# Patient Record
Sex: Female | Born: 1935 | Race: Black or African American | Hispanic: No | Smoking: Never smoker
Health system: Southern US, Community
[De-identification: ages and names within clinical notes are randomized; demographics above are authoritative.]

## PROBLEM LIST (undated history)

## (undated) DIAGNOSIS — N183 Chronic kidney disease, stage 3 unspecified: Secondary | ICD-10-CM

## (undated) DIAGNOSIS — L989 Disorder of the skin and subcutaneous tissue, unspecified: Secondary | ICD-10-CM

## (undated) DIAGNOSIS — Z9289 Personal history of other medical treatment: Secondary | ICD-10-CM

## (undated) DIAGNOSIS — A048 Other specified bacterial intestinal infections: Secondary | ICD-10-CM

## (undated) DIAGNOSIS — D649 Anemia, unspecified: Secondary | ICD-10-CM

## (undated) DIAGNOSIS — M810 Age-related osteoporosis without current pathological fracture: Secondary | ICD-10-CM

## (undated) DIAGNOSIS — C801 Malignant (primary) neoplasm, unspecified: Secondary | ICD-10-CM

## (undated) DIAGNOSIS — I10 Essential (primary) hypertension: Secondary | ICD-10-CM

## (undated) HISTORY — DX: Personal history of other medical treatment: Z92.89

## (undated) HISTORY — DX: Age-related osteoporosis without current pathological fracture: M81.0

## (undated) HISTORY — DX: Other specified bacterial intestinal infections: A04.8

## (undated) HISTORY — DX: Chronic kidney disease, stage 3 (moderate): N18.3

## (undated) HISTORY — DX: Disorder of the skin and subcutaneous tissue, unspecified: L98.9

## (undated) HISTORY — PX: OTHER SURGICAL HISTORY: SHX169

## (undated) HISTORY — DX: Chronic kidney disease, stage 3 unspecified: N18.30

## (undated) HISTORY — DX: Essential (primary) hypertension: I10

---

## 1998-10-14 ENCOUNTER — Inpatient Hospital Stay (HOSPITAL_COMMUNITY): Admission: EM | Admit: 1998-10-14 | Discharge: 1998-10-18 | Payer: Self-pay | Admitting: Emergency Medicine

## 1998-10-16 ENCOUNTER — Encounter: Payer: Self-pay | Admitting: Family Medicine

## 1998-10-26 ENCOUNTER — Encounter: Admission: RE | Admit: 1998-10-26 | Discharge: 1998-10-26 | Payer: Self-pay | Admitting: Family Medicine

## 2001-05-30 ENCOUNTER — Other Ambulatory Visit: Admission: RE | Admit: 2001-05-30 | Discharge: 2001-05-30 | Payer: Self-pay | Admitting: Obstetrics and Gynecology

## 2001-09-01 ENCOUNTER — Encounter: Admission: RE | Admit: 2001-09-01 | Discharge: 2001-09-01 | Payer: Self-pay | Admitting: Family Medicine

## 2001-09-01 ENCOUNTER — Encounter: Payer: Self-pay | Admitting: Family Medicine

## 2002-04-11 ENCOUNTER — Emergency Department (HOSPITAL_COMMUNITY): Admission: EM | Admit: 2002-04-11 | Discharge: 2002-04-11 | Payer: Self-pay | Admitting: Emergency Medicine

## 2002-09-08 ENCOUNTER — Encounter: Admission: RE | Admit: 2002-09-08 | Discharge: 2002-09-08 | Payer: Self-pay | Admitting: Family Medicine

## 2002-09-08 ENCOUNTER — Encounter: Payer: Self-pay | Admitting: Family Medicine

## 2003-02-12 ENCOUNTER — Emergency Department (HOSPITAL_COMMUNITY): Admission: EM | Admit: 2003-02-12 | Discharge: 2003-02-12 | Payer: Self-pay | Admitting: Emergency Medicine

## 2003-05-25 ENCOUNTER — Encounter: Admission: RE | Admit: 2003-05-25 | Discharge: 2003-05-25 | Payer: Self-pay | Admitting: Family Medicine

## 2003-05-29 ENCOUNTER — Encounter: Admission: RE | Admit: 2003-05-29 | Discharge: 2003-05-29 | Payer: Self-pay | Admitting: Family Medicine

## 2003-06-13 ENCOUNTER — Encounter: Admission: RE | Admit: 2003-06-13 | Discharge: 2003-06-13 | Payer: Self-pay | Admitting: Sports Medicine

## 2003-06-28 ENCOUNTER — Encounter: Admission: RE | Admit: 2003-06-28 | Discharge: 2003-06-28 | Payer: Self-pay | Admitting: Family Medicine

## 2003-07-12 ENCOUNTER — Encounter: Admission: RE | Admit: 2003-07-12 | Discharge: 2003-07-12 | Payer: Self-pay | Admitting: Family Medicine

## 2003-08-23 ENCOUNTER — Encounter: Admission: RE | Admit: 2003-08-23 | Discharge: 2003-08-23 | Payer: Self-pay | Admitting: Family Medicine

## 2003-09-06 ENCOUNTER — Encounter: Admission: RE | Admit: 2003-09-06 | Discharge: 2003-09-06 | Payer: Self-pay | Admitting: Sports Medicine

## 2003-09-10 ENCOUNTER — Encounter: Admission: RE | Admit: 2003-09-10 | Discharge: 2003-09-10 | Payer: Self-pay | Admitting: Sports Medicine

## 2003-09-21 ENCOUNTER — Encounter: Admission: RE | Admit: 2003-09-21 | Discharge: 2003-09-21 | Payer: Self-pay | Admitting: Family Medicine

## 2003-10-08 ENCOUNTER — Encounter: Admission: RE | Admit: 2003-10-08 | Discharge: 2003-10-08 | Payer: Self-pay | Admitting: Family Medicine

## 2003-10-23 ENCOUNTER — Encounter: Admission: RE | Admit: 2003-10-23 | Discharge: 2003-10-23 | Payer: Self-pay | Admitting: Family Medicine

## 2004-01-17 ENCOUNTER — Emergency Department (HOSPITAL_COMMUNITY): Admission: EM | Admit: 2004-01-17 | Discharge: 2004-01-18 | Payer: Self-pay | Admitting: Emergency Medicine

## 2004-06-27 ENCOUNTER — Ambulatory Visit: Payer: Self-pay | Admitting: Family Medicine

## 2004-07-15 ENCOUNTER — Ambulatory Visit: Payer: Self-pay | Admitting: Family Medicine

## 2004-09-23 ENCOUNTER — Ambulatory Visit: Payer: Self-pay | Admitting: Family Medicine

## 2005-02-25 ENCOUNTER — Ambulatory Visit: Payer: Self-pay | Admitting: Family Medicine

## 2005-07-08 ENCOUNTER — Ambulatory Visit: Payer: Self-pay | Admitting: Family Medicine

## 2005-10-08 ENCOUNTER — Ambulatory Visit: Payer: Self-pay | Admitting: Family Medicine

## 2005-10-22 ENCOUNTER — Ambulatory Visit: Payer: Self-pay | Admitting: Family Medicine

## 2005-11-24 ENCOUNTER — Ambulatory Visit: Payer: Self-pay | Admitting: Family Medicine

## 2005-12-02 ENCOUNTER — Ambulatory Visit: Payer: Self-pay | Admitting: Family Medicine

## 2006-01-01 ENCOUNTER — Ambulatory Visit: Payer: Self-pay | Admitting: Family Medicine

## 2006-03-02 ENCOUNTER — Ambulatory Visit: Payer: Self-pay | Admitting: Sports Medicine

## 2006-03-11 ENCOUNTER — Ambulatory Visit: Payer: Self-pay | Admitting: Family Medicine

## 2006-07-08 ENCOUNTER — Ambulatory Visit: Payer: Self-pay | Admitting: Family Medicine

## 2006-07-08 ENCOUNTER — Encounter (INDEPENDENT_AMBULATORY_CARE_PROVIDER_SITE_OTHER): Payer: Self-pay | Admitting: Family Medicine

## 2006-07-08 LAB — CONVERTED CEMR LAB
ALT: 9 units/L (ref 0–35)
AST: 12 units/L (ref 0–37)
Alkaline Phosphatase: 70 units/L (ref 39–117)
CO2: 26 meq/L (ref 19–32)
Creatinine, Ser: 1.87 mg/dL — ABNORMAL HIGH (ref 0.40–1.20)
Sodium: 144 meq/L (ref 135–145)
Total Bilirubin: 0.3 mg/dL (ref 0.3–1.2)
Total Protein: 7.2 g/dL (ref 6.0–8.3)

## 2006-08-19 DIAGNOSIS — M1990 Unspecified osteoarthritis, unspecified site: Secondary | ICD-10-CM | POA: Insufficient documentation

## 2006-08-19 DIAGNOSIS — M949 Disorder of cartilage, unspecified: Secondary | ICD-10-CM

## 2006-08-19 DIAGNOSIS — M899 Disorder of bone, unspecified: Secondary | ICD-10-CM | POA: Insufficient documentation

## 2006-08-19 DIAGNOSIS — G4700 Insomnia, unspecified: Secondary | ICD-10-CM

## 2006-08-19 DIAGNOSIS — I10 Essential (primary) hypertension: Secondary | ICD-10-CM | POA: Insufficient documentation

## 2006-08-19 DIAGNOSIS — N183 Chronic kidney disease, stage 3 (moderate): Secondary | ICD-10-CM

## 2006-10-15 ENCOUNTER — Encounter (INDEPENDENT_AMBULATORY_CARE_PROVIDER_SITE_OTHER): Payer: Self-pay | Admitting: Family Medicine

## 2006-10-15 ENCOUNTER — Ambulatory Visit: Payer: Self-pay | Admitting: Family Medicine

## 2006-10-15 DIAGNOSIS — M25569 Pain in unspecified knee: Secondary | ICD-10-CM

## 2006-10-15 LAB — CONVERTED CEMR LAB
BUN: 32 mg/dL — ABNORMAL HIGH (ref 6–23)
Blood in Urine, dipstick: NEGATIVE
Creatinine, Ser: 2.09 mg/dL — ABNORMAL HIGH (ref 0.40–1.20)
Glucose, Urine, Semiquant: NEGATIVE
Potassium: 3.9 meq/L (ref 3.5–5.3)
Protein, U semiquant: NEGATIVE
Urobilinogen, UA: NEGATIVE

## 2006-10-26 ENCOUNTER — Encounter: Admission: RE | Admit: 2006-10-26 | Discharge: 2006-10-26 | Payer: Self-pay | Admitting: Sports Medicine

## 2006-10-29 ENCOUNTER — Telehealth (INDEPENDENT_AMBULATORY_CARE_PROVIDER_SITE_OTHER): Payer: Self-pay | Admitting: *Deleted

## 2006-11-01 ENCOUNTER — Telehealth: Payer: Self-pay | Admitting: *Deleted

## 2006-12-01 ENCOUNTER — Encounter (INDEPENDENT_AMBULATORY_CARE_PROVIDER_SITE_OTHER): Payer: Self-pay | Admitting: Family Medicine

## 2006-12-09 ENCOUNTER — Encounter (INDEPENDENT_AMBULATORY_CARE_PROVIDER_SITE_OTHER): Payer: Self-pay | Admitting: Family Medicine

## 2006-12-13 ENCOUNTER — Encounter: Admission: RE | Admit: 2006-12-13 | Discharge: 2006-12-13 | Payer: Self-pay | Admitting: Nephrology

## 2006-12-16 ENCOUNTER — Ambulatory Visit: Payer: Self-pay | Admitting: Oncology

## 2006-12-28 ENCOUNTER — Ambulatory Visit: Payer: Self-pay | Admitting: Family Medicine

## 2007-01-05 LAB — CBC WITH DIFFERENTIAL/PLATELET
BASO%: 0.3 % (ref 0.0–2.0)
EOS%: 0.6 % (ref 0.0–7.0)
LYMPH%: 29.4 % (ref 14.0–48.0)
MCH: 31.1 pg (ref 26.0–34.0)
MCHC: 34.3 g/dL (ref 32.0–36.0)
MONO#: 0.7 10*3/uL (ref 0.1–0.9)
Platelets: 225 10*3/uL (ref 145–400)
RBC: 3.64 10*6/uL — ABNORMAL LOW (ref 3.70–5.32)
WBC: 5.7 10*3/uL (ref 3.9–10.0)

## 2007-01-06 ENCOUNTER — Ambulatory Visit (HOSPITAL_COMMUNITY): Admission: RE | Admit: 2007-01-06 | Discharge: 2007-01-06 | Payer: Self-pay | Admitting: Oncology

## 2007-01-07 LAB — COMPREHENSIVE METABOLIC PANEL
ALT: 8 U/L (ref 0–35)
Albumin: 4.4 g/dL (ref 3.5–5.2)
CO2: 24 mEq/L (ref 19–32)
Calcium: 9.4 mg/dL (ref 8.4–10.5)
Chloride: 106 mEq/L (ref 96–112)
Potassium: 4 mEq/L (ref 3.5–5.3)
Sodium: 141 mEq/L (ref 135–145)
Total Protein: 7.3 g/dL (ref 6.0–8.3)

## 2007-01-07 LAB — LACTATE DEHYDROGENASE: LDH: 187 U/L (ref 94–250)

## 2007-01-07 LAB — SPEP & IFE WITH QIG
Albumin ELP: 58.5 % (ref 55.8–66.1)
Alpha-1-Globulin: 5.4 % — ABNORMAL HIGH (ref 2.9–4.9)
Alpha-2-Globulin: 10.7 % (ref 7.1–11.8)
IgG (Immunoglobin G), Serum: 1040 mg/dL (ref 694–1618)
M-Spike, %: 0.32 g/dL
Total Protein, Serum Electrophoresis: 7.3 g/dL (ref 6.0–8.3)

## 2007-01-13 LAB — UIFE/LIGHT CHAINS/TP QN, 24-HR UR
Albumin, U: DETECTED
Free Lambda Excretion/Day: 5.18 mg/d
Free Lambda Lt Chains,Ur: 0.37 mg/dL (ref 0.08–1.01)
Gamma Globulin, Urine: DETECTED — AB
Time: 24 hours
Volume, Urine: 1400 mL

## 2007-01-18 ENCOUNTER — Encounter (INDEPENDENT_AMBULATORY_CARE_PROVIDER_SITE_OTHER): Payer: Self-pay | Admitting: Family Medicine

## 2007-03-18 ENCOUNTER — Encounter (INDEPENDENT_AMBULATORY_CARE_PROVIDER_SITE_OTHER): Payer: Self-pay | Admitting: Family Medicine

## 2007-03-18 ENCOUNTER — Ambulatory Visit: Payer: Self-pay | Admitting: Family Medicine

## 2007-03-18 DIAGNOSIS — M6740 Ganglion, unspecified site: Secondary | ICD-10-CM | POA: Insufficient documentation

## 2007-03-21 LAB — CONVERTED CEMR LAB
Chloride: 104 meq/L (ref 96–112)
Glucose, Bld: 106 mg/dL — ABNORMAL HIGH (ref 70–99)
Potassium: 4.2 meq/L (ref 3.5–5.3)
Sodium: 142 meq/L (ref 135–145)

## 2007-04-06 ENCOUNTER — Encounter (INDEPENDENT_AMBULATORY_CARE_PROVIDER_SITE_OTHER): Payer: Self-pay | Admitting: Family Medicine

## 2007-07-18 ENCOUNTER — Ambulatory Visit: Payer: Self-pay | Admitting: Oncology

## 2007-09-19 ENCOUNTER — Ambulatory Visit: Payer: Self-pay | Admitting: Sports Medicine

## 2007-10-18 ENCOUNTER — Telehealth (INDEPENDENT_AMBULATORY_CARE_PROVIDER_SITE_OTHER): Payer: Self-pay | Admitting: *Deleted

## 2007-10-19 ENCOUNTER — Ambulatory Visit: Payer: Self-pay | Admitting: Family Medicine

## 2007-10-19 DIAGNOSIS — K117 Disturbances of salivary secretion: Secondary | ICD-10-CM

## 2007-10-26 ENCOUNTER — Encounter: Payer: Self-pay | Admitting: *Deleted

## 2007-10-26 ENCOUNTER — Telehealth: Payer: Self-pay | Admitting: *Deleted

## 2007-11-16 ENCOUNTER — Encounter (INDEPENDENT_AMBULATORY_CARE_PROVIDER_SITE_OTHER): Payer: Self-pay | Admitting: Family Medicine

## 2007-12-29 ENCOUNTER — Ambulatory Visit: Payer: Self-pay | Admitting: Family Medicine

## 2008-03-13 ENCOUNTER — Telehealth: Payer: Self-pay | Admitting: *Deleted

## 2008-03-14 ENCOUNTER — Ambulatory Visit: Payer: Self-pay | Admitting: Family Medicine

## 2008-03-14 DIAGNOSIS — M109 Gout, unspecified: Secondary | ICD-10-CM

## 2008-07-03 ENCOUNTER — Ambulatory Visit: Payer: Self-pay | Admitting: Family Medicine

## 2008-07-09 ENCOUNTER — Ambulatory Visit: Payer: Self-pay | Admitting: Family Medicine

## 2008-07-09 ENCOUNTER — Encounter: Payer: Self-pay | Admitting: Family Medicine

## 2008-07-10 LAB — CONVERTED CEMR LAB
Cholesterol: 217 mg/dL — ABNORMAL HIGH (ref 0–200)
LDL Cholesterol: 95 mg/dL (ref 0–99)
Total CHOL/HDL Ratio: 2.1
Triglycerides: 88 mg/dL (ref <150)
VLDL: 18 mg/dL (ref 0–40)

## 2008-11-23 ENCOUNTER — Telehealth: Payer: Self-pay | Admitting: Family Medicine

## 2009-01-01 ENCOUNTER — Telehealth: Payer: Self-pay | Admitting: Family Medicine

## 2009-01-10 ENCOUNTER — Ambulatory Visit: Payer: Self-pay | Admitting: Family Medicine

## 2009-01-10 DIAGNOSIS — R140 Abdominal distension (gaseous): Secondary | ICD-10-CM

## 2009-01-10 DIAGNOSIS — J309 Allergic rhinitis, unspecified: Secondary | ICD-10-CM | POA: Insufficient documentation

## 2009-03-09 IMAGING — CR DG KNEE 3 VIEWS*R*
3 series · 3 of 3 positions shown · non-contrast
Comparison: None.

RIGHT KNEE - 3 VIEW:

CLINICAL DATA: Knee pain

[view not recorded (1 of 3)]
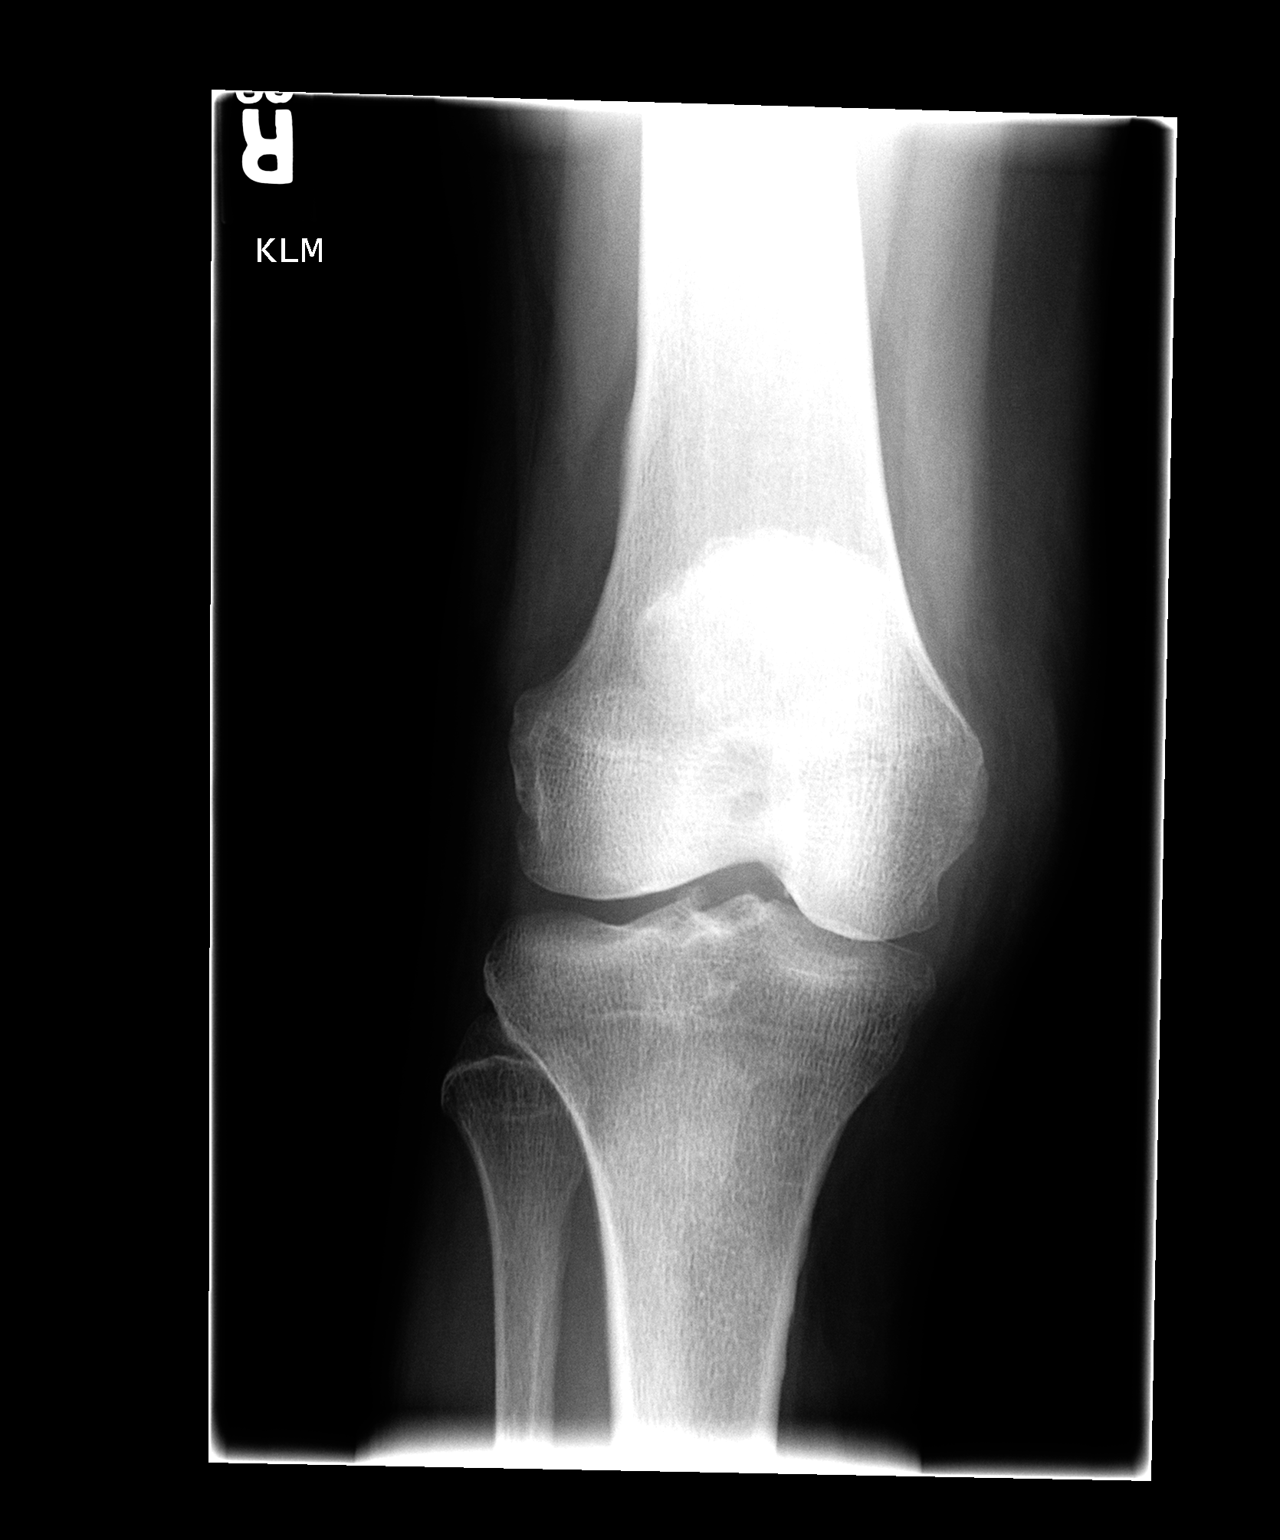

[view not recorded (2 of 3)]
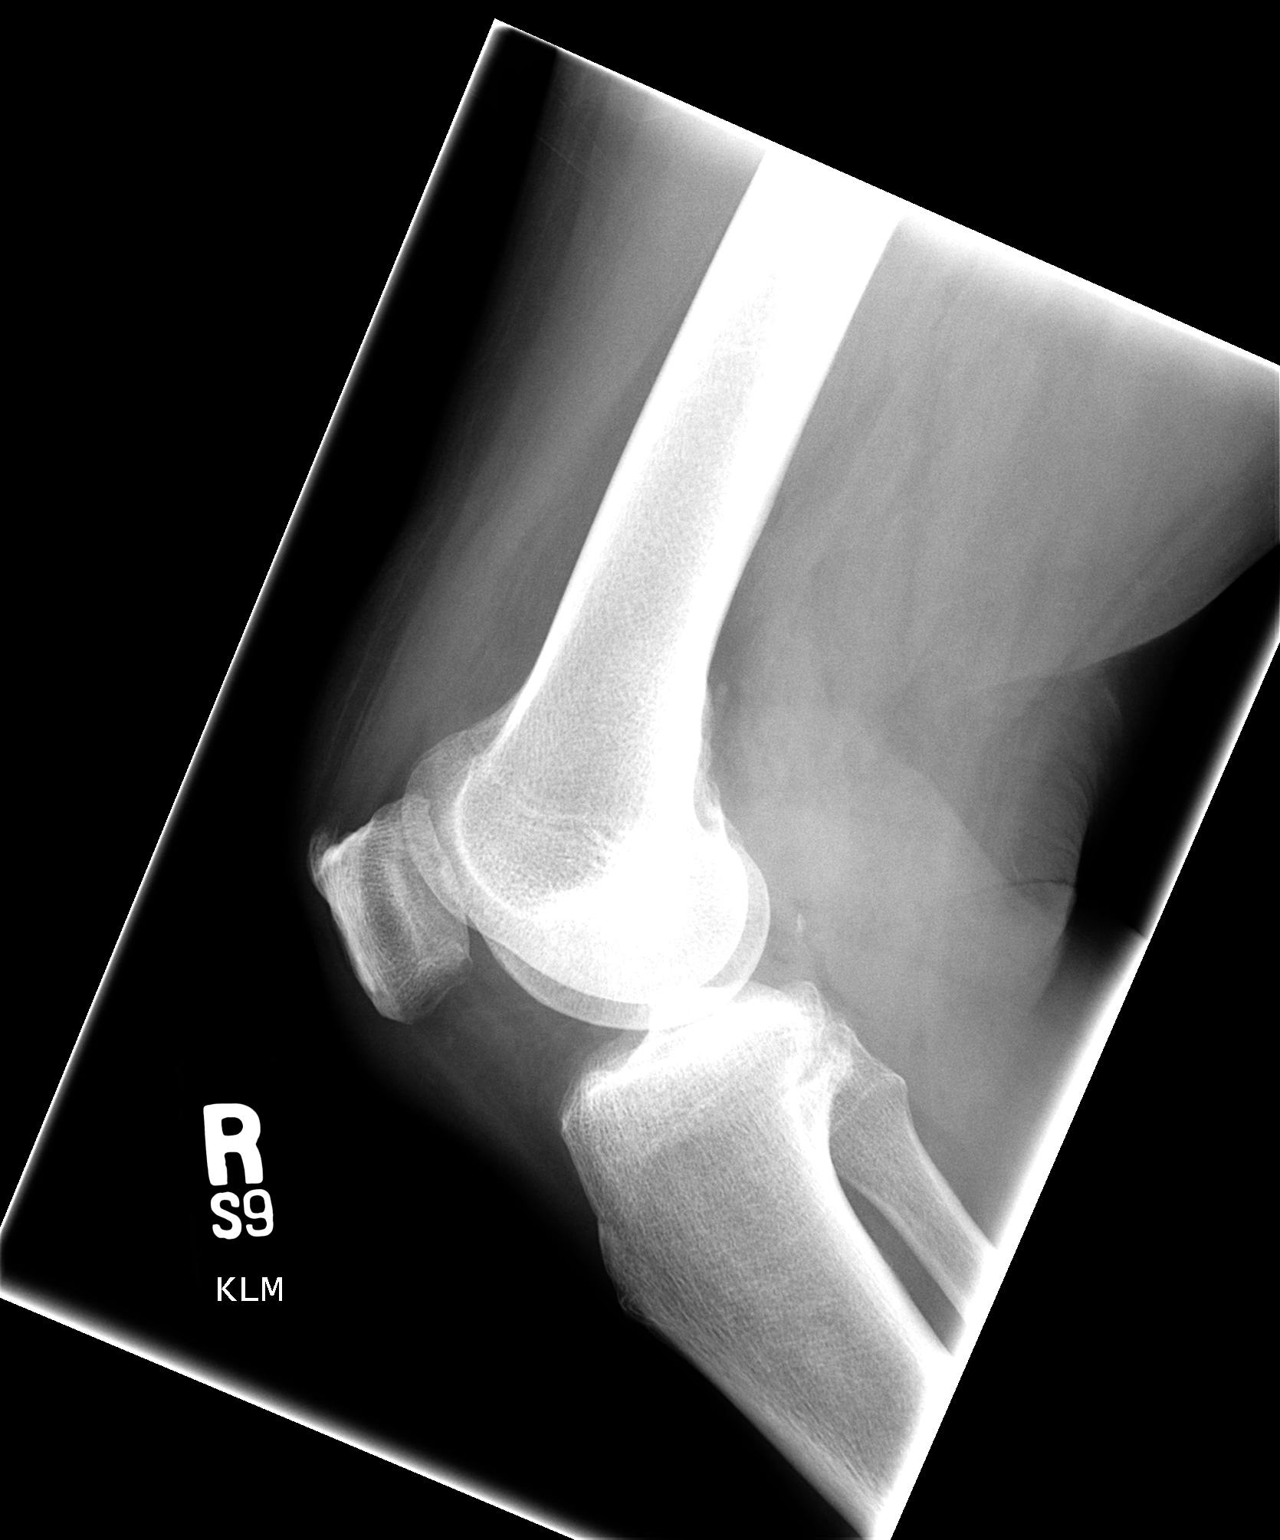

[view not recorded (3 of 3)]
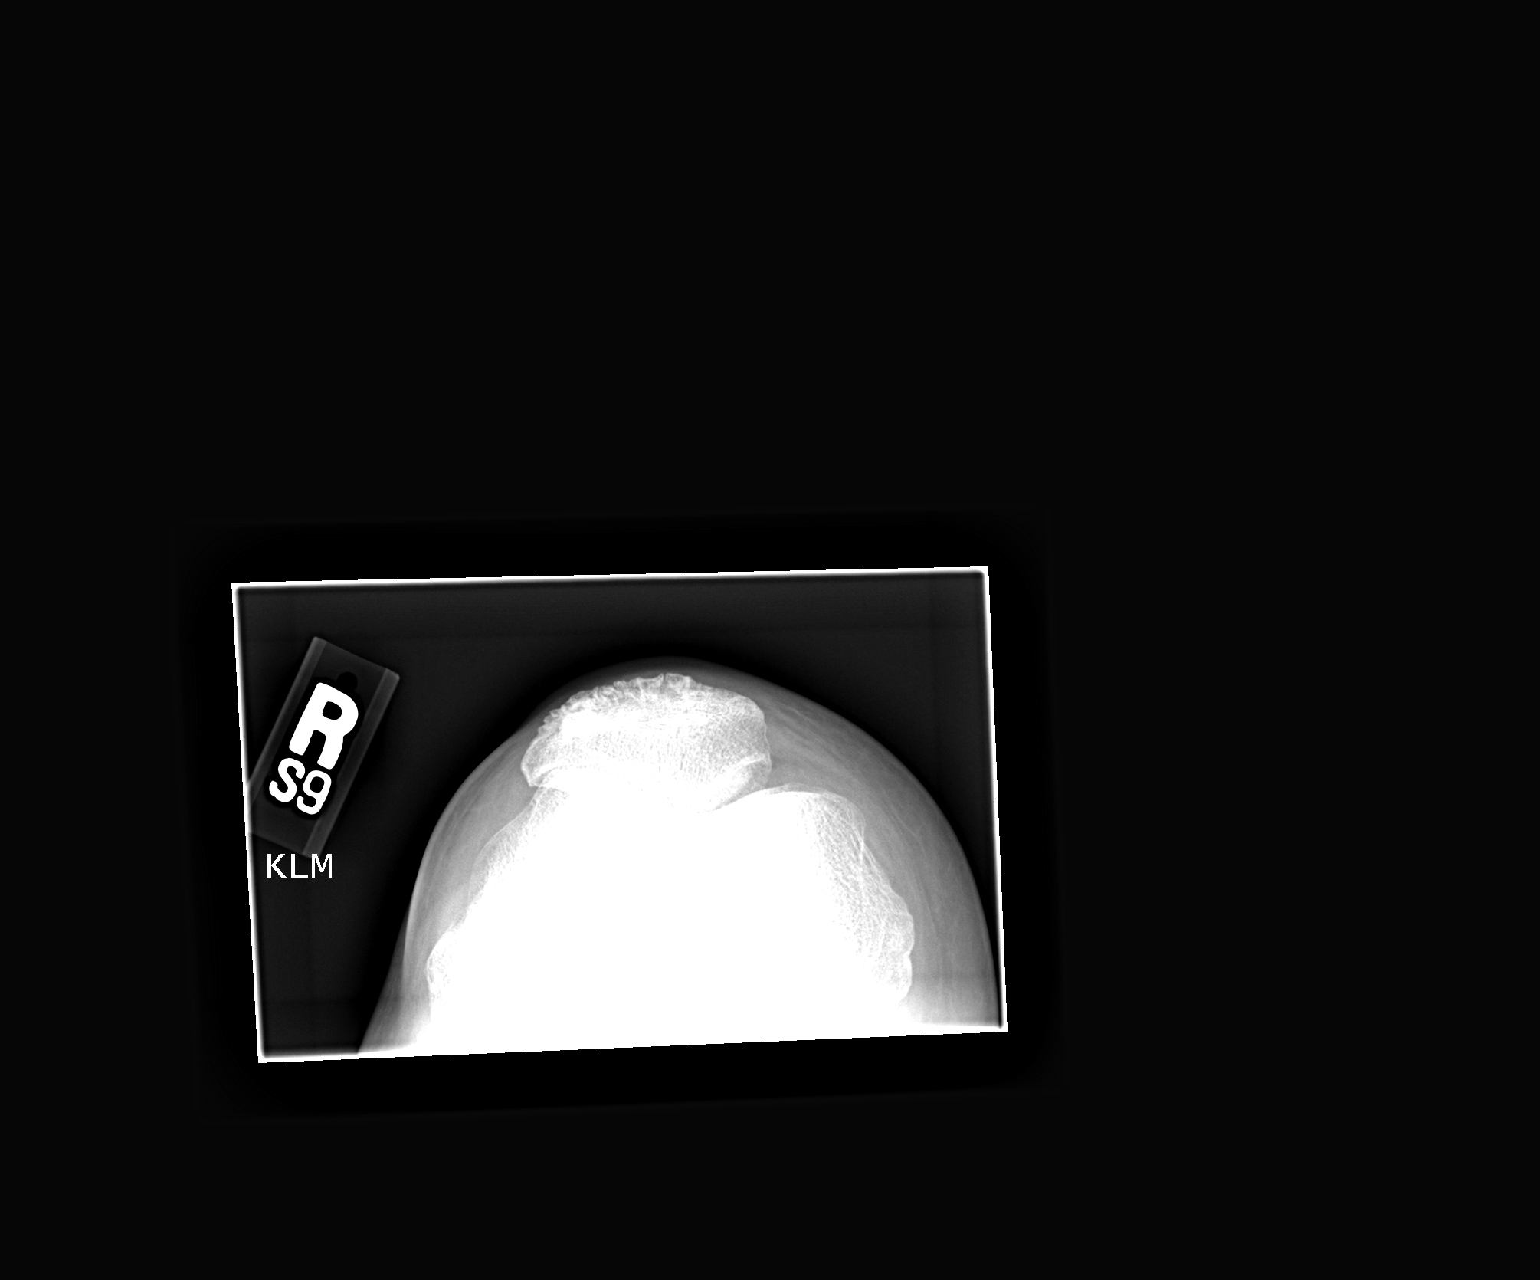

[3 of 3 positions shown; findings below may reference images not displayed]

FINDINGS: No evidence for acute fracture or subluxation. There may be some
joint space narrowing in the medial compartment. Mild tricompartmental
hypertrophic spurring is noted. No evidence for joint effusion.
IMPRESSION: Mild tricompartmental degenerative changes. No acute bony abnormality.

## 2009-05-30 ENCOUNTER — Telehealth: Payer: Self-pay | Admitting: Family Medicine

## 2009-06-26 ENCOUNTER — Encounter: Payer: Self-pay | Admitting: Family Medicine

## 2009-06-26 ENCOUNTER — Ambulatory Visit: Payer: Self-pay | Admitting: Family Medicine

## 2009-06-26 LAB — CONVERTED CEMR LAB
Nitrite: NEGATIVE
Urobilinogen, UA: 0.2

## 2009-06-27 LAB — CONVERTED CEMR LAB
BUN: 23 mg/dL (ref 6–23)
Potassium: 3.9 meq/L (ref 3.5–5.3)
Sodium: 142 meq/L (ref 135–145)

## 2009-07-22 ENCOUNTER — Telehealth: Payer: Self-pay | Admitting: Family Medicine

## 2009-10-15 ENCOUNTER — Ambulatory Visit: Payer: Self-pay | Admitting: Family Medicine

## 2009-12-04 ENCOUNTER — Telehealth: Payer: Self-pay | Admitting: Family Medicine

## 2010-07-13 ENCOUNTER — Encounter: Payer: Self-pay | Admitting: Nephrology

## 2010-07-16 ENCOUNTER — Encounter (INDEPENDENT_AMBULATORY_CARE_PROVIDER_SITE_OTHER): Payer: Self-pay | Admitting: *Deleted

## 2010-07-22 NOTE — Assessment & Plan Note (Signed)
Summary: f/up,tcb   Vital Signs:  Patient profile:   77 year old female Height:      65 inches Weight:      151 pounds Temp:     97.9 degrees F oral Pulse rate:   74 / minute BP sitting:   164 / 70  (right arm) Cuff size:   regular  Vitals Entered By: Tessie Fass CMA (June 26, 2009 4:51 PM) CC: F/U Pain Assessment Patient in pain? no        Primary Care Provider:  Bobby Rumpf  MD  CC:  F/U.  History of Present Illness: 1)  HTN: 164/70. Denies chest pain, headache, dyspnea, LE edema, side effects from meds. On clonidine, norvasc, coreg. Last Cr check in 2008 = 2.11   2) Insomina: still continues to have. drnks ne cup coffee in am, two mountain dews in afternoon, evening. waking at night 2-3 tmes to urinate. no incontinence or dysuria or increased thirst. goes to bed at 9:30, wakes up around 12 , hard to go back to sleep. watches some tv before bed. quiet dark environment for sleep. denies snoring. epworth sleepiness scale score = 4 on last check, low prob sleep apnea. denies depressive symptoms.     Habits & Providers  Alcohol-Tobacco-Diet     Tobacco Status: never  Current Medications (verified): 1)  Aspirin Ec 81 Mg Tbec (Aspirin) .... Take 1 Tablet By Mouth Once A Day 2)  Caltrate 600+d Plus 600-400 Mg-Unit Tabs (Calcium Carbonate-Vit D-Min) .... Take 1 Tablet By Mouth Twice A Day 3)  Coreg 25 Mg Tabs (Carvedilol) .... Take 1 Tablet By Mouth Twice A Day 4)  Norvasc 10 Mg Tabs (Amlodipine Besylate) .... Take 1 Tablet By Mouth Once A Day 5)  Clonidine Hcl 0.1 Mg Tabs (Clonidine Hcl) .... One Tab By Mouth Bid 6)  Dialyvite   Tabs (B Complex-C-Folic Acid) .Marland Kitchen.. 1 Tablet A Day 7)  Prednisone 20 Mg Tabs (Prednisone) .... 2 Tabs By Mouth For 3 Days Then One Tab By Mouth For 4 Days 8)  Gas Relief 80 Mg Chew (Simethicone) .... Take One Pill By Mouth Up To 4 Times A Day As Needed For Gas or Bloating 9)  Allergy 10 Mg Tabs (Loratadine) .... Take One Tablet By Mouth Once A  Day As Needed For Allergies  Allergies (verified): 1)  ! Ace Inhibitors  Physical Exam  General:  alert, well-developed, well-nourished, and well-hydrated.   Eyes:  perrl, eomi Mouth:  mmm Neck:  no bruits or jvd Lungs:  ctab w/o crackles or wheeze, normal wob Heart:  normal rate, regular rhythm, no murmur, no gallop, and no rub.   Pulses:  2 radials Extremities:  no edema  Neurologic:  alert & oriented X3 and cranial nerves II-XII intact.     Impression & Recommendations:  Problem # 1:  HYPERTENSION, BENIGN SYSTEMIC (ICD-401.1) Assessment Unchanged above goal today. will allow permissive hypertension given age higher 160/90. advised regarding salt intake, exercise.   Her updated medication list for this problem includes:    Coreg 25 Mg Tabs (Carvedilol) .Marland Kitchen... Take 1 tablet by mouth twice a day    Norvasc 10 Mg Tabs (Amlodipine besylate) .Marland Kitchen... Take 1 tablet by mouth once a day    Clonidine Hcl 0.1 Mg Tabs (Clonidine hcl) ..... One tab by mouth bid  Orders: Basic Met-[facility] ([address]62-95284) Urinalysis-[facility] (00000) [facility]- Est  Level 4 (13244)  Problem # 2:  RENAL INSUFFICIENCY, CHRONIC (ICD-586) will check bmet, ua.  Orders: [facility]- Est  Level 4 (13086)  Problem # 3:  INSOMNIA NOS (ICD-780.52) Assessment: Unchanged   Will not start medication at this time due to patient preference. Advised patient regarding reorienting day/night cues, finding activities to keep busy during day, and overall good sleep hygiene. Advised to decrease caffeine. Will check ua, bmet to eval nocturia. follow up one month  Orders: [facility]- Est  Level 4 (99214)  Complete Medication List: 1)  Aspirin Ec 81 Mg Tbec (Aspirin) .... Take 1 tablet by mouth once a day 2)  Caltrate 600+d Plus 600-400 Mg-unit Tabs (Calcium carbonate-vit d-min) .... Take 1 tablet by mouth twice a day 3)  Coreg 25 Mg Tabs (Carvedilol) .... Take 1 tablet by mouth twice a day 4)  Norvasc 10 Mg Tabs (Amlodipine besylate) .... Take 1  tablet by mouth once a day 5)  Clonidine Hcl 0.1 Mg Tabs (Clonidine hcl) .... One tab by mouth bid 6)  Dialyvite Tabs (B complex-c-folic acid) .Marland Kitchen.. 1 tablet a day 7)  Prednisone 20 Mg Tabs (Prednisone) .... 2 tabs by mouth for 3 days then one tab by mouth for 4 days 8)  Gas Relief 80 Mg Chew (Simethicone) .... Take one pill by mouth up to 4 times a day as needed for gas or bloating 9)  Allergy 10 Mg Tabs (Loratadine) .... Take one tablet by mouth once a day as needed for allergies  Laboratory Results   Urine Tests  Date/Time Received: June 26, 2009 5:08 PM  Date/Time Reported: June 26, 2009 5:34 PM   Routine Urinalysis   Color: yellow Appearance: Clear Glucose: negative   (Normal Range: Negative) Bilirubin: negative   (Normal Range: Negative) Ketone: negative   (Normal Range: Negative) Spec. Gravity: 1.015   (Normal Range: 1.003-1.035) Blood: negative   (Normal Range: Negative) pH: 5.5   (Normal Range: 5.0-8.0) Protein: 100   (Normal Range: Negative) Urobilinogen: 0.2   (Normal Range: 0-1) Nitrite: negative   (Normal Range: Negative) Leukocyte Esterace: trace   (Normal Range: Negative)  Urine Microscopic WBC/HPF: 5-10 RBC/HPF: rare Bacteria/HPF: 2+ Mucous/HPF: trace Epithelial/HPF: 1-5 Casts/LPF: occ hyaline Yeast/HPF: hypae/spores present Other: occ trich present    Comments: ...........test performed by...........Marland KitchenTerese Door, CMA       Prevention & Chronic Care Immunizations   Influenza vaccine: Not documented   Influenza vaccine due: Refused  (07/03/2008)    Tetanus booster: 09/21/2003: Done.   Tetanus booster due: 09/20/2013    Pneumococcal vaccine: Not documented    H. zoster vaccine: Not documented  Colorectal Screening   Hemoccult: Done.  (02/25/2005)   Hemoccult due: 02/25/2006    Colonoscopy: Not documented  Other Screening   Pap smear: Not documented    Mammogram: Done.  (08/21/2003)   Mammogram due: 08/20/2004    DXA bone  density scan: Done.  (10/21/2003)   DXA scan due: None    Smoking status: never  (06/26/2009)  Lipids   Total Cholesterol: 217  (07/09/2008)   LDL: 95  (07/09/2008)   LDL Direct: Not documented   HDL: 104  (07/09/2008)   Triglycerides: 88  (07/09/2008)  Hypertension   Last Blood Pressure: 164 / 70  (06/26/2009)   Serum creatinine: 2.11  (03/18/2007)   BMP action: Ordered   Serum potassium 4.2  (03/18/2007)   Basic metabolic panel due: 12/24/2009    Hypertension flowsheet reviewed?: Yes   Progress toward BP goal: Unchanged  Self-Management Support :   Personal Goals (by the next clinic visit) :  Personal blood pressure goal: 160/90  (06/26/2009)   Patient will work on the following items until the next clinic visit to reach self-care goals:     Medications and monitoring: take my medicines every day, check my blood pressure, bring all of my medications to every visit, weigh myself weekly  (06/26/2009)     Eating: use fresh or frozen vegetables, eat foods that are low in salt  (06/26/2009)    Hypertension self-management support: Written self-care plan, Education handout, Pre-printed educational material  (06/26/2009)   Hypertension self-care plan printed.   Hypertension education handout printed

## 2010-07-22 NOTE — Progress Notes (Signed)
Summary: Rx Req  Phone Note Refill Request Call back at Home Phone [phone] Message from:  DUAGHTER Christine Armstrong  Refills Requested: Medication #1:  COREG 25 MG TABS Take 1 tablet by mouth twice a day PT USES [facility].  Initial call taken by: Clydell Hakim,  July 22, 2009 4:55 PM  Follow-up for Phone Call        Refilled via e-script.  Follow-up by: Bobby Rumpf  MD,  July 23, 2009 8:46 AM

## 2010-07-22 NOTE — Assessment & Plan Note (Signed)
Summary: f/up,tcb   Vital Signs:  Patient profile:   77 year old female Height:      65 inches Weight:      144.6 pounds Temp:     97.8 degrees F oral Pulse rate:   74 / minute BP sitting:   128 / 72  (left arm) Cuff size:   regular  Vitals Entered By: Garen Grams LPN (October 15, 2009 4:31 PM) CC: f/u last visit Is Patient Diabetic? No Pain Assessment Patient in pain? no        Primary Care Provider:  Bobby Rumpf  MD  CC:  f/u last visit.  History of Present Illness: 1) HTN: 128/72 today. Last visit was 164/70. Denies chest pain, headache, dyspnea, LE edema, side effects from meds. On clonidine, norvasc, coreg. No changes made to medications; patient has been "eating more salads and watching salt". Last Cr check in 1/11 = 1.81. In 2008 was = 2.11   2) Insomina: Resolved. Has eliminated caffeine, beverages before bedtime. No more waking at night to urinate. Improved sleep hygiene with less TV before bed. Denies depressive symptoms.   3) Flatulence: Reports flatulence and bloating intermittently which was relieved by simethicone pills. She has not taken these in a few months and gas has returned. Denies constipation, diarrhea, abdominal pain.     Habits & Providers  Alcohol-Tobacco-Diet     Tobacco Status: never  Current Medications (verified): 1)  Aspirin Ec 81 Mg Tbec (Aspirin) .... Take 1 Tablet By Mouth Once A Day 2)  Caltrate 600+d Plus 600-400 Mg-Unit Tabs (Calcium Carbonate-Vit D-Min) .... Take 1 Tablet By Mouth Twice A Day 3)  Coreg 25 Mg Tabs (Carvedilol) .... Take 1 Tablet By Mouth Twice A Day 4)  Norvasc 10 Mg Tabs (Amlodipine Besylate) .... Take 1 Tablet By Mouth Once A Day 5)  Clonidine Hcl 0.1 Mg Tabs (Clonidine Hcl) .... One Tab By Mouth Bid 6)  Dialyvite   Tabs (B Complex-C-Folic Acid) .Marland Kitchen.. 1 Tablet A Day 7)  Prednisone 20 Mg Tabs (Prednisone) .... 2 Tabs By Mouth For 3 Days Then One Tab By Mouth For 4 Days 8)  Gas Relief 80 Mg Chew (Simethicone) ....  Take One Pill By Mouth Up To 4 Times A Day As Needed For Gas or Bloating 9)  Allergy 10 Mg Tabs (Loratadine) .... Take One Tablet By Mouth Once A Day As Needed For Allergies  Allergies (verified): 1)  ! Ace Inhibitors  Physical Exam  General:  alert, well-developed, well-nourished, and well-hydrated.   Lungs:  ctab w/o crackles or wheeze, normal wob Heart:  normal rate, regular rhythm, no murmur, no gallop, and no rub.   Abdomen:  +BS, S/ND/NT, mo masses  Pulses:  2 radials Extremities:  no edema  Neurologic:  alert & oriented X3.     Impression & Recommendations:  Problem # 1:  HYPERTENSION, BENIGN SYSTEMIC (ICD-401.1)  Improved with DASH diet. Will continue meds as below. Goal of 160/80 given age. Will follow up one year.  Her updated medication list for this problem includes:    Coreg 25 Mg Tabs (Carvedilol) .Marland Kitchen... Take 1 tablet by mouth twice a day    Norvasc 10 Mg Tabs (Amlodipine besylate) .Marland Kitchen... Take 1 tablet by mouth once a day    Clonidine Hcl 0.1 Mg Tabs (Clonidine hcl) ..... One tab by mouth bid  BP today: 128/72 Prior BP: 164/70 (06/26/2009)  Labs Reviewed: K+: 3.9 (06/26/2009) Creat: : 1.81 (06/26/2009)   Chol: 217 (07/09/2008)  HDL: 104 (07/09/2008)   LDL: 95 (07/09/2008)   TG: 88 (07/09/2008)  Orders: [facility]- Est  Level 4 ([address])  Problem # 2:  FLATULENCE (ICD-787.3)  Simethicone for relief as needed. No red flags on history or exam.   Orders: [facility]- Est  Level 4 (60454)  Problem # 3:  INSOMNIA NOS (ICD-780.52) Assessment: Improved  Resolved. Advised to continue good sleep hygiene, decreased caffeine.   Orders: [facility]- Est  Level 4 (99214)  Complete Medication List: 1)  Aspirin Ec 81 Mg Tbec (Aspirin) .... Take 1 tablet by mouth once a day 2)  Caltrate 600+d Plus 600-400 Mg-unit Tabs (Calcium carbonate-vit d-min) .... Take 1 tablet by mouth twice a day 3)  Coreg 25 Mg Tabs (Carvedilol) .... Take 1 tablet by mouth twice a day 4)  Norvasc 10 Mg Tabs  (Amlodipine besylate) .... Take 1 tablet by mouth once a day 5)  Clonidine Hcl 0.1 Mg Tabs (Clonidine hcl) .... One tab by mouth bid 6)  Dialyvite Tabs (B complex-c-folic acid) .Marland Kitchen.. 1 tablet a day 7)  Prednisone 20 Mg Tabs (Prednisone) .... 2 tabs by mouth for 3 days then one tab by mouth for 4 days 8)  Gas Relief 80 Mg Chew (Simethicone) .... Take one pill by mouth up to 4 times a day as needed for gas or bloating 9)  Allergy 10 Mg Tabs (Loratadine) .... Take one tablet by mouth once a day as needed for allergies Prescriptions: GAS RELIEF 80 MG CHEW (SIMETHICONE) Take one pill by mouth up to 4 times a day as needed for gas or bloating  #30 x 6   Entered and Authorized by:   Bobby Rumpf  MD   Signed by:   Bobby Rumpf  MD on 10/15/2009   Method used:   Electronically to        [facility] [id number] (retail)       [address] [place], [place]  [address]       Ph: [phone]       Fax: [phone]   RxID:   [id number] COREG 25 MG TABS (CARVEDILOL) Take 1 tablet by mouth twice a day  #60 Tablet x 5   Entered and Authorized by:   Bobby Rumpf  MD   Signed by:   Bobby Rumpf  MD on 10/15/2009   Method used:   Electronically to        [facility] [address] (retail)       [address] [place], [place]  [address]       Ph: [phone]       Fax: [phone]   RxID:   [id number] NORVASC 10 MG TABS (AMLODIPINE BESYLATE) Take 1 tablet by mouth once a day  #30 Tablet x 5   Entered and Authorized by:   Bobby Rumpf  MD   Signed by:   Bobby Rumpf  MD on 10/15/2009   Method used:   Electronically to        [facility] [address] (retail)       [address] [place], [place]  [address]       Ph: [phone]       Fax: [phone]   RxID:  [id number] CLONIDINE HCL 0.1 MG TABS (CLONIDINE HCL) one tab by mouth bid  #60 x 3   Entered and Authorized by:   Bobby Rumpf  MD   Signed by:    Bobby Rumpf  MD on 10/15/2009   Method used:   Electronically to        [facility] [address] (retail)       [address]       [place], [place]  [address]       Ph: [phone]       Fax: [phone]   RxID:   [id number]    Prevention & Chronic Care Immunizations   Influenza vaccine: Not documented   Influenza vaccine due: Refused  (07/03/2008)    Tetanus booster: 09/21/2003: Done.   Tetanus booster due: 09/20/2013    Pneumococcal vaccine: Not documented    H. zoster vaccine: Not documented  Colorectal Screening   Hemoccult: Done.  (02/25/2005)   Hemoccult due: 02/25/2006    Colonoscopy: Not documented  Other Screening   Pap smear: Not documented    Mammogram: Done.  (08/21/2003)   Mammogram due: 08/20/2004    DXA bone density scan: Done.  (10/21/2003)   DXA scan due: None    Smoking status: never  (10/15/2009)  Lipids   Total Cholesterol: 217  (07/09/2008)   LDL: 95  (07/09/2008)   LDL Direct: Not documented   HDL: 104  (07/09/2008)   Triglycerides: 88  (07/09/2008)  Hypertension   Last Blood Pressure: 128 / 72  (10/15/2009)   Serum creatinine: 1.81  (06/26/2009)   BMP action: Ordered   Serum potassium 3.9  (06/26/2009)   Basic metabolic panel due: 12/24/2009    Hypertension flowsheet reviewed?: Yes   Progress toward BP goal: At goal  Self-Management Support :   Personal Goals (by the next clinic visit) :      Personal blood pressure goal: 160/80  (10/15/2009)   Hypertension self-management support: Written self-care plan, Education handout, Pre-printed educational material  (06/26/2009)    Hypertension self-management support not done because: Good outcomes  (10/15/2009)

## 2010-07-22 NOTE — Progress Notes (Signed)
Summary: Rx Ques  Phone Note Call from Patient Call back at [phone]   Caller: Daughter-Jawonda Winfree Summary of Call: Would like to verify the medications her mother is on. Initial call taken by: Clydell Hakim,  December 04, 2009 11:59 AM  Follow-up for Phone Call        message left to return call. Follow-up by: Theresia Lo RN,  December 04, 2009 12:12 PM  Additional Follow-up for Phone Call Additional follow up Details #1::        daughter returned call Additional Follow-up by: De Nurse,  December 04, 2009 1:38 PM    Additional Follow-up for Phone Call Additional follow up Details #2::    called and left message again for daughter. advised that in order to give this information to her patient , will need to sign a ROI. advised we can print out copy of her meds and patient can pick up and then give to her.  Follow-up by: Theresia Lo RN,  December 04, 2009 2:50 PM  Additional Follow-up for Phone Call Additional follow up Details #3:: Details for Additional Follow-up Action Taken: Daughter said to please call at [phone]. Additional Follow-up by: Clydell Hakim,  December 04, 2009 4:29 PM  Prescriptions: DIALYVITE   TABS (B COMPLEX-C-FOLIC ACID) 1 tablet a day  #100 x 12   Entered and Authorized by:   Bobby Rumpf  MD   Signed by:   Bobby Rumpf  MD on 12/06/2009   Method used:   Electronically to        [facility] [address] (retail)       [address]       [place], [place]  [address]       Ph: [phone]       Fax: [phone]   RxID:   [id number]  spoke with daughter and then with patient and she states her daughter has been taking care of her meds for 10 years and she gives ok to talk with her. went over med list and all is correct except patient has no current Rx for Dialyvite ( folic acid). will send message to MD to ask if she needs to take this and if so please send in Rx to  [facility]   Theresia Lo RN  December 04, 2009 4:42  PM  Refilled. Discussed with daughter. Uncertain as to why started on Folic acid - no levels were ever drawn, and no CBC (for macrocytic anemia) in records, but will continue for now at patient's daughter's request. Bobby Rumpf  MD  December 06, 2009 2:39 PM

## 2010-07-24 NOTE — Letter (Signed)
Summary: Generic Letter  [facility]  [address]   [place], [place] [address]   Phone: [phone]  Fax: [phone]    07/16/2010  [address] [place], [place]  [address]  Dear Ms. Allison Quarry,  We are happy to let you know that since you are covered under Medicare you are able to have a FREE visit at the [facility] to discuss your HEALTH. This is a new benefit for Medicare.  There will be no co-payment.  At this visit you will meet with Arlys John an expert in wellness and the health coach at our clinic.  At this visit we will discuss ways to keep you healthy and feeling well.  This visit will not replace your regular doctor visit and we cannot refill medications.     You will need to plan to be here at least one hour to talk about your medical history, your current status, review all of your medications, and discuss your future plans for your health.  This information will be entered into your record for your doctor to have and review.  If you are interested in staying healthy, this type of visit can help.  Please call the office at: [phone], to schedule a "Medicare Wellness Visit".  The day of the visit you should bring in all of your medications, including any vitamins, herbs, over the counter products you take.  Make a list of all the other doctors that you see, so we know who they are. If you have any other health documents please bring them.  We look forward to helping you stay healthy.  Sincerely,   [facility]  iAWV

## 2010-08-22 ENCOUNTER — Emergency Department (HOSPITAL_COMMUNITY): Payer: Medicare Other

## 2010-08-22 ENCOUNTER — Observation Stay (HOSPITAL_COMMUNITY)
Admission: EM | Admit: 2010-08-22 | Discharge: 2010-08-23 | Disposition: A | Discharge status: Home / Self Care | Payer: Medicare Other | Attending: Family Medicine | Admitting: Family Medicine

## 2010-08-22 DIAGNOSIS — R0789 Other chest pain: Principal | ICD-10-CM | POA: Insufficient documentation

## 2010-08-22 DIAGNOSIS — I129 Hypertensive chronic kidney disease with stage 1 through stage 4 chronic kidney disease, or unspecified chronic kidney disease: Secondary | ICD-10-CM | POA: Insufficient documentation

## 2010-08-22 DIAGNOSIS — R0602 Shortness of breath: Secondary | ICD-10-CM | POA: Insufficient documentation

## 2010-08-22 DIAGNOSIS — M1990 Unspecified osteoarthritis, unspecified site: Secondary | ICD-10-CM | POA: Insufficient documentation

## 2010-08-22 DIAGNOSIS — E785 Hyperlipidemia, unspecified: Secondary | ICD-10-CM | POA: Insufficient documentation

## 2010-08-22 DIAGNOSIS — N189 Chronic kidney disease, unspecified: Secondary | ICD-10-CM | POA: Insufficient documentation

## 2010-08-22 DIAGNOSIS — Z7982 Long term (current) use of aspirin: Secondary | ICD-10-CM | POA: Insufficient documentation

## 2010-08-22 DIAGNOSIS — M109 Gout, unspecified: Secondary | ICD-10-CM | POA: Insufficient documentation

## 2010-08-22 LAB — DIFFERENTIAL
Basophils Relative: 0 % (ref 0–1)
Eosinophils Absolute: 0.1 10*3/uL (ref 0.0–0.7)
Monocytes Relative: 12 % (ref 3–12)
Neutrophils Relative %: 49 % (ref 43–77)

## 2010-08-22 LAB — BASIC METABOLIC PANEL
Calcium: 9.2 mg/dL (ref 8.4–10.5)
Chloride: 101 mEq/L (ref 96–112)
Creatinine, Ser: 1.54 mg/dL — ABNORMAL HIGH (ref 0.4–1.2)
GFR calc Af Amer: 39 mL/min — ABNORMAL LOW (ref >60)

## 2010-08-22 LAB — POCT CARDIAC MARKERS
CKMB, poc: 1.2 ng/mL (ref 1.0–8.0)
Myoglobin, poc: 145 ng/mL (ref 12–200)
Troponin i, poc: <0.05 ng/mL (ref 0.00–0.09)

## 2010-08-22 LAB — CBC
MCH: 29 pg (ref 26.0–34.0)
Platelets: 263 10*3/uL (ref 150–400)
RBC: 4.1 MIL/uL (ref 3.87–5.11)

## 2010-08-23 ENCOUNTER — Other Ambulatory Visit: Payer: Self-pay | Admitting: Family Medicine

## 2010-08-23 ENCOUNTER — Observation Stay (HOSPITAL_COMMUNITY): Payer: Medicare Other

## 2010-08-23 LAB — CBC
MCH: 35.3 pg — ABNORMAL HIGH (ref 26.0–34.0)
MCHC: 35.2 g/dL (ref 30.0–36.0)
MCHC: 33 g/dL (ref 30.0–36.0)
MCV: 100.3 fL — ABNORMAL HIGH (ref 78.0–100.0)
Platelets: 216 10*3/uL (ref 150–400)
RDW: 13.2 % (ref 11.5–15.5)
RDW: 13.4 % (ref 11.5–15.5)
WBC: 4.4 10*3/uL (ref 4.0–10.5)

## 2010-08-23 LAB — TSH: TSH: 0.617 u[IU]/mL (ref 0.350–4.500)

## 2010-08-23 LAB — BASIC METABOLIC PANEL
Calcium: 9 mg/dL (ref 8.4–10.5)
GFR calc Af Amer: 41 mL/min — ABNORMAL LOW (ref >60)
GFR calc non Af Amer: 34 mL/min — ABNORMAL LOW (ref >60)
Sodium: 141 mEq/L (ref 135–145)

## 2010-08-23 LAB — LIPID PANEL
Total CHOL/HDL Ratio: 2 RATIO
Triglycerides: 45 mg/dL (ref <150)
VLDL: 9 mg/dL (ref 0–40)

## 2010-08-23 LAB — CARDIAC PANEL(CRET KIN+CKTOT+MB+TROPI)
Relative Index: INVALID (ref 0.0–2.5)
Total CK: 70 U/L (ref 7–177)

## 2010-08-23 LAB — CK TOTAL AND CKMB (NOT AT ARMC)
Relative Index: INVALID (ref 0.0–2.5)
Total CK: 84 U/L (ref 7–177)

## 2010-08-23 LAB — HEMOGLOBIN A1C: Mean Plasma Glucose: 134 mg/dL — ABNORMAL HIGH (ref <117)

## 2010-08-25 ENCOUNTER — Telehealth: Payer: Self-pay | Admitting: Family Medicine

## 2010-08-25 ENCOUNTER — Encounter: Payer: Self-pay | Admitting: Family Medicine

## 2010-08-25 NOTE — Telephone Encounter (Signed)
Went to ED Friday for Chest Pain & L arm numbness. Out of amlodipine since yesterday. Says she called the pharmacy last Friday. Told her I will forward to someone to fill this.Faustino Congress

## 2010-08-25 NOTE — Telephone Encounter (Signed)
Attempted to reorder amlopidine electronically.   Notice states that medication has previous  pended modifications and I am unable to order. Rx called in by phone for one month supply.  Patient has appointment in office tomorrow. Dr. Leveda Anna consulted. Will send message to  Dr. Wallene Huh.

## 2010-08-25 NOTE — Telephone Encounter (Signed)
Checking status of rx request for amlodipine, pt completely out, pt goes to [facility]

## 2010-08-25 NOTE — Telephone Encounter (Signed)
Refill request

## 2010-08-25 NOTE — Telephone Encounter (Signed)
This encounter was created in error - please disregard.

## 2010-08-26 ENCOUNTER — Encounter: Payer: Self-pay | Admitting: Family Medicine

## 2010-08-26 ENCOUNTER — Ambulatory Visit (INDEPENDENT_AMBULATORY_CARE_PROVIDER_SITE_OTHER): Payer: Medicare Other | Admitting: Family Medicine

## 2010-08-26 DIAGNOSIS — R140 Abdominal distension (gaseous): Secondary | ICD-10-CM

## 2010-08-26 DIAGNOSIS — R142 Eructation: Secondary | ICD-10-CM

## 2010-08-26 NOTE — Patient Instructions (Signed)
Take the simethicone tablets as prescribed to help break up gas - this may be the source of your pain. If things are not getting better in one month, come back in to see me.

## 2010-08-27 ENCOUNTER — Encounter: Payer: Self-pay | Admitting: Family Medicine

## 2010-08-27 NOTE — Assessment & Plan Note (Signed)
Likely source of referred pain, given reported symptoms. Will start on simethicone as before. Follow up in one month for CPE.Continue cardiac risk modification. Red flags reviewed with patient.

## 2010-08-27 NOTE — Progress Notes (Signed)
  Subjective:    Patient ID: Christine Armstrong, female    DOB: 1924-04-02, 77 y.o.   MRN: [id number]  HPI 1) Follow up hospitalization: Admitted 08/22/10 to 08/23/10 for atypical chest pain, ruled out for MI. Patient reports that her pain is non-exertional, not relieved by rest and is not substernal. Usually occurs after eating and usually occurs in the morning. Pain is located at left shoulder and radiates down her left arm, and is worse with moving her arm. Pain lasts for 30 minutes to an hour. Pain is sharp and squeezing. Pain is relieved by burping. Patient has history of "gas pains" and flatulence and was started on simethicone by me but had never picked this up. Denies nausea, emesis, heartburn, dyspnea, diaphoresis, LE edema, dysphagia.   Review of Systems As per HPI otherwise negative.    Objective:   Physical Exam  Constitutional: She appears well-developed and well-nourished. No distress.  Neck: Normal range of motion. Neck supple.  Cardiovascular: Normal rate, regular rhythm and normal heart sounds.   No murmur heard. Pulmonary/Chest: Effort normal. No respiratory distress. She has no wheezes. She has no rales. She exhibits no tenderness.  Abdominal: Soft. Bowel sounds are normal. She exhibits no distension and no mass. There is no tenderness. There is no rebound and no guarding.  Musculoskeletal: Normal range of motion. She exhibits no edema and no tenderness.          Assessment & Plan:

## 2010-09-18 ENCOUNTER — Ambulatory Visit: Payer: Self-pay | Admitting: Family Medicine

## 2010-09-19 ENCOUNTER — Ambulatory Visit (INDEPENDENT_AMBULATORY_CARE_PROVIDER_SITE_OTHER): Payer: Medicare Other | Admitting: Family Medicine

## 2010-09-19 ENCOUNTER — Encounter: Payer: Self-pay | Admitting: Family Medicine

## 2010-09-19 DIAGNOSIS — R140 Abdominal distension (gaseous): Secondary | ICD-10-CM

## 2010-09-19 DIAGNOSIS — R141 Gas pain: Secondary | ICD-10-CM

## 2010-09-19 DIAGNOSIS — M62838 Other muscle spasm: Secondary | ICD-10-CM

## 2010-09-20 DIAGNOSIS — M62838 Other muscle spasm: Secondary | ICD-10-CM | POA: Insufficient documentation

## 2010-09-20 NOTE — Assessment & Plan Note (Signed)
Trigger point injection successful. Treat with OTC tylenol prn.

## 2010-09-20 NOTE — Assessment & Plan Note (Addendum)
Improved with simethicone. Continue use prn. Avoid triggers.

## 2010-09-20 NOTE — Progress Notes (Signed)
  Subjective:    Patient ID: Christine Armstrong, female    DOB: [birth year], 77 y.o.   MRN: [id number]  HPI 1) "Gas pains" / shoulder pain: Patient has history of "gas pains" and flatulence and was started on simethicone by me in the past but had never picked this up. Restarted on this at last visit and notes relief of gas pains, but notes a different pain and tightness in her trapezius area, made worse with moving left shoulder, turning head to right. Admitted 08/22/10 to 08/23/10 for atypical chest pain, ruled out for MI (pain was felt to be musculoskeletal related). Patient reports that her pain is non-exertional, not relieved by rest and is not substernal. Usually occurs after eating and usually occurs in the morning. Pain is located at left shoulder and radiates down her left arm, and is worse with moving her arm. Pain lasts for 30 minutes to an hour. Pain is sharp and squeezing. Pain is relieved by burping. Denies nausea, emesis, heartburn, dyspnea, diaphoresis, LE edema, dysphagia.    Review of Systems As per HPI otherwise negative     Objective:   Physical Exam General: elderly, pleasant female, NAD  Lungs: CTAB Heart: RRR, no murmurs  MSK: full ROM at left shoulder with extension, flexion, internal, external rotation. Negative "drop arm, hawkins, empty can tests. Spasm noted in left trapezius with two tripper points noted superiorly   Trigger point injection performed. LEFT trapezius. Consent obtained after explaining procedure. Area cleaned and prepped with alcohol. Topical anesthetic used. 25 gauge needle used to introduce 1/2 cc kenalog 40 / 1/2 cc 2% lidocaine w/ epinephrine into two trigger points left trapezius. Tolerated well. Bandaged with bandaid. Aftercare instructions given.        Assessment & Plan:

## 2010-09-22 ENCOUNTER — Telehealth: Payer: Self-pay | Admitting: Family Medicine

## 2010-09-22 ENCOUNTER — Other Ambulatory Visit: Payer: Self-pay | Admitting: Family Medicine

## 2010-09-22 NOTE — Telephone Encounter (Signed)
Called pharmacy and they do have rx . They will get ready for patient.  Message left on daughter's voicemail.

## 2010-09-22 NOTE — Telephone Encounter (Signed)
Refill request

## 2010-09-22 NOTE — Telephone Encounter (Signed)
Pt's daughter calling regarding mom's amlodipine.  Did not receive original rx filled on 3/3.  Wanted to pick up rx today.

## 2010-09-23 NOTE — H&P (Signed)
Christine Armstrong, Christine Armstrong                ACCOUNT NO.:  [id number]  MEDICAL RECORD NO.:  [id number]           PATIENT TYPE:  I  LOCATION:  4705                         FACILITY:  [facility]  PHYSICIAN:  Pearlean Brownie, M.D.DATE OF BIRTH:  10/04/1923  DATE OF ADMISSION:  08/22/2010 DATE OF DISCHARGE:                             HISTORY & PHYSICAL   PRIMARY CARE PROVIDER:  Bobby Rumpf, MD at [facility].  CHIEF COMPLAINT:  Chest pain.  HISTORY OF PRESENT ILLNESS:  The patient states that she has been having intermittent chest pain for about 3-day.  Tonight around 7:00 p.m. she stood up from the couch and began walking and the chest pain was much worse than it had been before.  For the past several days she has associated the chest pain with exertion up walking.  She says that the pain feels like a squeezing pain, it is not sharp but it goes into her left shoulder and arm.  She says she has not felt short of breath or been diaphoretic associated with this pain.  She denies any other associated symptoms and she says she has never had this pain before.  REVIEW OF SYSTEMS:  Negative for fevers.  Negative for rhinorrhea. Negative for cough.  Negative for dyspnea.  Negative for palpitations. Positive for chest pain.  Negative for nausea, vomiting, diarrhea. Negative for dysuria.  Negative for malaise.  Negative for headaches or visual changes.  ALLERGIES:  ACE INHIBITORS.  HOME MEDICATIONS: 1. Aspirin 81 mg p.o. daily. 2. Coreg 25 mg p.o. b.i.d. 3. Norvasc 10 mg p.o. daily. 4. Clonidine 0.1 mg p.o. b.i.d.  PAST MEDICAL HISTORY:  Significant for; 1. Hypertension. 2. Osteoarthritis. 3. Chronic kidney disease, baseline creatinine 1.7-2.0. 4. History of gout.  SOCIAL HISTORY:  The patient lives with her daughter and her granddaughter.  She is retired.  She denies tobacco, alcohol or drug use.  FAMILY HISTORY:  The patient's mother died of lung cancer.  Her father died  of bladder cancer.  She has a son who died of a myocardial infarction at the age of 52.  Daughter with hypertension.  PHYSICAL EXAMINATION:  VITAL SIGNS:  Temperature 98.7, pulse is 77, respiratory rate 20, blood pressure 155/79, pulse ox is 97% on room air. GENERAL:  The patient is in no acute distress. HEAD, EYES, EARS, NOSE AND THROAT:  Extraocular movements are intact. Pupils are equal, round and reactive to light. NECK:  No JVD.  No adenopathy.  No thyromegaly. CARDIOVASCULAR:  Regular rate and rhythm.  No murmurs, rubs or gallops. CHEST WALL:  The patient has positive reproducible pain with pushing on her chest on the left side. ABDOMEN:  Positive bowel sounds.  Soft, nontender, nondistended. EXTREMITIES:  2+ Pulses.  No edema. NEUROLOGIC:  Alert and oriented x3.  No focal deficits. MUSCULOSKELETAL:  The patient has positive radicular pain that reproduces on pushing on her head.  LABORATORY FINDINGS AND STUDIES: 1. CBC; white blood cell count 5.1, hemoglobin 11.9, hematocrit 35.7,    platelets 263, normal differential. 2. Basic metabolic panel; sodium 136, potassium 4.0, chloride 101,  bicarb 26, BUN 22, creatinine 1.54, glucose 123, calcium 9.2. 3. Point-of-care cardiac enzymes, CK-MB is 1.2, troponin I is less     than 0.05, myoglobin is 145. 4. EKG shows normal sinus rhythm.  No T-wave abnormalities.  ASSESSMENT/PLAN:  This is an 77 year old female who presents with chest pain. 1. Chest pain.  The patient has reproducible signs of musculoskeletal     pain.  However, she does associate the chest pain with exertion.     We will admit her to telemetry and cycle cardiac enzymes to rule     out acute coronary artery syndrome.  We will risk stratify, obtain     hemoglobin A1c, fasting lipid panel and TSH.  We will repeat an EKG     in the morning because the patient's pain is reproducible with     radiculopathy and pushing her chest wall.  We will order a chest x-     ray as  well as a C-spine film to rule out any sort of     musculoskeletal fracture as the cause of her pain and we will     continue her home aspirin 81 mg p.o. daily. 2. Hypertension.  Continue home medications, Coreg, clonidine and     Norvasc for blood pressure control. 3. Fluids, electrolytes, nutrition, gastrointestinal.  We will give a     heart healthy diet.  We will Hep-Lock her IV and for GI     prophylaxis, we will give Protonix 400 mg p.o. daily. 4. Deep vein thrombosis prophylaxis, heparin 5000 units subcu t.i.d. 5. Disposition is pending clinical improvement and acute coronary     artery syndrome rule out.    ______________________________ Christine Gal, MD   ______________________________ Pearlean Brownie, M.D.    CR/MEDQ  D:  08/23/2010  T:  08/23/2010  Job:  [id number]  Electronically Signed by Christine Gal MD on 09/17/2010 04:26:29 PM Electronically Signed by Pearlean Brownie M.D. on 09/23/2010 03:36:25 PM

## 2010-09-23 NOTE — Discharge Summary (Signed)
Christine Armstrong, Christine Armstrong                ACCOUNT NO.:  [id number]  MEDICAL RECORD NO.:  [id number]           PATIENT TYPE:  I  LOCATION:  4705                         FACILITY:  [facility]  PHYSICIAN:  Pearlean Brownie, M.D.DATE OF BIRTH:  [birth year]  DATE OF ADMISSION:  08/22/2010 DATE OF DISCHARGE:  08/23/2010                              DISCHARGE SUMMARY   FINAL DIAGNOSIS:  Atypical chest pain, likely musculoskeletal in nature.  ADDITIONAL DIAGNOSES: 1. Hypertension. 2 . Osteoarthritis. 1. Chronic kidney disease. 2. Gout.  DISCHARGE MEDICATIONS: 1. Aspirin 81 mg p.o. daily. 2. Clonidine 0.1 mg p.o. b.i.d. 3. Coreg 25 mg p.o. b.i.d. 4. Norvasc 10 mg p.o. daily.  LABORATORY RESULTS:  The patient's point-of-care enzymes were negative. The patient's initial CBC showed a white count of 5.1, hemoglobin 11.9, hematocrit 35.7, and platelets 263.  Her BMET was significant for a glucose of 123 and creatinine of 1.54.  Her first set of troponins was 0.01.  CBC on day of discharge was stable from the day before.  Her BMET on day of discharge showed a creatinine of 1.46 and her final troponin was 0.01.  Her lipid profile was drawn, although this was not a fasting lipid; her total cholesterol was 166, triglycerides 45, HDL 83, LDL 74. Her TSH was 0.617 and her A1c was 6.3.  IMAGING STUDIES:  Her cervical spine film showed moderate spondylolisthesis the lower lumbar spine which does not appear to be medically changed and no acute fracture or traumatic subluxation.  Her chest x-ray showed no active disease with hyperinflation, stable right paratracheal soft tissue prominence.  BRIEF HOSPITAL COURSE:  This patient was admitted with complaint of atypical chest pain.  She described the pain as being intermittent for the last 3 days.  However, at 7 p.m. the night of admission, she stood up from the couch and began walking, and the chest pain was worse than had been before.  She described the  pain as worse with exertion and better with rest, and felt like squeezing rather than sharp, but said it radiates into her left shoulder and arm.  The patient was admitted to the hospital for rule out MI.  Her troponins were negative x2 over a period of 16 hours, and on day of discharge she did not have any chest pain.  She did, however, still complained of some neck and shoulder pain, which she says is very similar to the pain that she has been having that she initially came in on.  Her cervical spine films did not show any acute changes.  This pain was thought to be due to musculoskeletal or neuropathy.  She is going to follow up with her PCP, Dr. Wallene Huh for further workup of this problem.  The patient did not have any home medications change during the stay nor were any medications added.  She is felt to be stable on time of discharge.     Ellery Plunk, MD   ______________________________ Pearlean Brownie, M.D.    RS/MEDQ  D:  08/23/2010  T:  08/24/2010  Job:  [id number]  Electronically Signed by Ellery Plunk  on 09/01/2010  01:37:43 PM Electronically Signed by Pearlean Brownie M.D. on 09/23/2010 03:36:14 PM

## 2010-10-08 ENCOUNTER — Other Ambulatory Visit: Payer: Self-pay | Admitting: Family Medicine

## 2010-10-09 NOTE — Telephone Encounter (Signed)
Refill request

## 2010-11-22 ENCOUNTER — Other Ambulatory Visit: Payer: Self-pay | Admitting: Family Medicine

## 2010-11-24 NOTE — Telephone Encounter (Signed)
Refill request

## 2011-02-04 ENCOUNTER — Other Ambulatory Visit: Payer: Self-pay | Admitting: Family Medicine

## 2011-02-04 NOTE — Telephone Encounter (Signed)
Refill request

## 2011-04-04 ENCOUNTER — Other Ambulatory Visit: Payer: Self-pay | Admitting: Family Medicine

## 2011-04-05 NOTE — Telephone Encounter (Signed)
Refill request

## 2011-05-04 ENCOUNTER — Other Ambulatory Visit: Payer: Self-pay | Admitting: Family Medicine

## 2011-05-04 NOTE — Telephone Encounter (Signed)
Refill request

## 2011-06-18 ENCOUNTER — Other Ambulatory Visit: Payer: Self-pay | Admitting: Family Medicine

## 2011-06-18 NOTE — Telephone Encounter (Signed)
Refill request

## 2011-06-19 ENCOUNTER — Encounter: Payer: Self-pay | Admitting: Family Medicine

## 2011-06-19 ENCOUNTER — Ambulatory Visit (INDEPENDENT_AMBULATORY_CARE_PROVIDER_SITE_OTHER): Payer: Medicare Other | Admitting: Family Medicine

## 2011-06-19 DIAGNOSIS — N19 Unspecified kidney failure: Secondary | ICD-10-CM

## 2011-06-19 DIAGNOSIS — I10 Essential (primary) hypertension: Secondary | ICD-10-CM

## 2011-06-19 DIAGNOSIS — Z23 Encounter for immunization: Secondary | ICD-10-CM

## 2011-06-19 MED ORDER — CLONIDINE HCL 0.2 MG PO TABS: 0.2000 mg | ORAL_TABLET | Freq: Two times a day (BID) | ORAL | Status: DC

## 2011-06-19 MED ORDER — CARVEDILOL 25 MG PO TABS: 25.0000 mg | ORAL_TABLET | Freq: Two times a day (BID) | ORAL | Status: DC

## 2011-06-19 MED ORDER — AMLODIPINE BESYLATE 10 MG PO TABS: 10.0000 mg | ORAL_TABLET | Freq: Every day | ORAL | Status: DC

## 2011-06-19 NOTE — Assessment & Plan Note (Signed)
Patient has a history of CKD Lab Results  Component Value Date   CREATININE 1.46* 08/23/2010   Patient declined any type of lab draw today we will get some at her yearly in March or April.

## 2011-06-19 NOTE — Patient Instructions (Signed)
Very nice to meet you both. I refilled all your medications and changed to clonidine. I'm giving you a flu shot today. I when she to come back in 2 weeks to recheck her blood pressure with me. Happy new year!

## 2011-06-19 NOTE — Assessment & Plan Note (Signed)
Uncontrolled at this time will refill her medications and increase her clonidine to 0.2 mg we'll have patient come back in 2 weeks for recheck. Patient given red flags and what to look out for and when to seek medical attention.

## 2011-06-19 NOTE — Progress Notes (Signed)
  Subjective:    Patient ID: Christine Armstrong, female    DOB: 1923/07/05, 77 y.o.   MRN: [id number]  HPI patient states the real reason she is here is for medication refills. 1. Hypertension Blood pressure at home: Not checking Blood pressure today: 22/106 patient was 186/99 on recheck Taking Meds: Patient has been out of her amlodipine and clonidine for the last couple days. Side effects: None ROS: Denies headache visual changes nausea, vomiting, chest pain or abdominal pain or shortness of breath.  Chronic renal insufficiency: Patient has had this for some time we are avoiding any nephro toxic drugs at this time been doing very well. Patient states that she still makes good urine is having no back pain no shortness of breath and no abdominal pain out of the ordinary. Patient declined a blood draw today   Preventative care: Patient will take a flu shot but she has declined a Pneumovax and Zostavax and colonoscopy today.    Review of Systems Denies fever, chills, nausea vomiting abdominal pain, dysuria, chest pain, shortness of breath dyspnea on exertion or numbness in extremities Past medical history, social, surgical and family history all reviewed.     Objective:   Physical Exam Physical Exam  Constitutional: She appears well-developed and well-nourished. No distress.  Neck: Normal range of motion. Neck supple.  Cardiovascular: Normal rate, regular rhythm and normal heart sounds.   No murmur heard. Pulmonary/Chest: Effort normal. No respiratory distress. She has no wheezes. She has no rales. She exhibits no tenderness.  Abdominal: Soft. Bowel sounds are normal. She exhibits no distension and no mass. There is no tenderness. There is no rebound and no guarding.  Musculoskeletal: Normal range of motion. She exhibits no edema and no tenderness.     Assessment & Plan:

## 2011-07-08 ENCOUNTER — Encounter: Payer: Self-pay | Admitting: Family Medicine

## 2011-07-08 ENCOUNTER — Ambulatory Visit (INDEPENDENT_AMBULATORY_CARE_PROVIDER_SITE_OTHER): Payer: Medicare Other | Admitting: Family Medicine

## 2011-07-08 DIAGNOSIS — I10 Essential (primary) hypertension: Secondary | ICD-10-CM

## 2011-07-08 NOTE — Progress Notes (Signed)
  Subjective:    Patient ID: Christine Armstrong, female    DOB: [birth year], 78 y.o.   MRN: [id number]  HPI 1. Hypertension Blood pressure at home:140-160 SBP Blood pressure today: 106/64 Taking Meds:yes  Side effects: Only during the first 3 days now feeling great. ROS: Denies headache visual changes nausea, vomiting, chest pain or abdominal pain or shortness of breath.   Review of Systems As stated in the history of present illness    Objective:   Physical Exam  Constitutional: She appears well-developed and well-nourished. No distress.  Neck: Normal range of motion. Neck supple.  Cardiovascular: Normal rate, regular rhythm and normal heart sounds.   No murmur heard. Pulmonary/Chest: Effort normal. No respiratory distress. She has no wheezes. She has no rales. She exhibits no tenderness.  Abdominal: Soft. Bowel sounds are normal. She exhibits no distension and no mass. There is no tenderness. There is no rebound and no guarding.     Assessment & Plan:

## 2011-07-08 NOTE — Assessment & Plan Note (Signed)
Much better now patient is very well controlled. Is no longer symptomatic on medication. We'll have patient come back in 2 months time at that time we will get labs and monitor patient's chronic kidney disease and blood pressure.

## 2011-07-08 NOTE — Patient Instructions (Signed)
r blood pressure is better I will see you March 19th and we will get labs at that time.

## 2011-09-08 ENCOUNTER — Encounter: Payer: Self-pay | Admitting: Family Medicine

## 2011-09-08 ENCOUNTER — Ambulatory Visit (INDEPENDENT_AMBULATORY_CARE_PROVIDER_SITE_OTHER): Payer: Medicare Other | Admitting: Family Medicine

## 2011-09-08 VITALS — BP 150/79 | HR 71 | Temp 98.0°F | Ht 65.0 in | Wt 139.0 lb

## 2011-09-08 DIAGNOSIS — R739 Hyperglycemia, unspecified: Secondary | ICD-10-CM | POA: Insufficient documentation

## 2011-09-08 DIAGNOSIS — I10 Essential (primary) hypertension: Secondary | ICD-10-CM

## 2011-09-08 DIAGNOSIS — R7309 Other abnormal glucose: Secondary | ICD-10-CM

## 2011-09-08 DIAGNOSIS — M899 Disorder of bone, unspecified: Secondary | ICD-10-CM

## 2011-09-08 DIAGNOSIS — M949 Disorder of cartilage, unspecified: Secondary | ICD-10-CM

## 2011-09-08 NOTE — Patient Instructions (Signed)
You are doing great! I will get labs and call you with the results. We need to see you again in 6 months.

## 2011-09-08 NOTE — Assessment & Plan Note (Signed)
With Hx of osteopenia, pt declined DEXA scan, will continue to monitor.  Will get Vitamin D level today being this would be comparison for bone health and pt risk for falls.

## 2011-09-08 NOTE — Assessment & Plan Note (Signed)
Pt is doing well avoiding renal toxic medications at this time.  Continue to monitor.  Recheck in 6 months.

## 2011-09-08 NOTE — Progress Notes (Signed)
  Subjective:    Patient ID: Christine Armstrong, female    DOB: 05-03-1924, 78 y.o.   MRN: [id number]  Hypertension   1. Hypertension Blood pressure at home: Not checking Blood pressure today:150/79 Taking Meds: yes Side effects: None ROS: Denies headache visual changes nausea, vomiting, chest pain or abdominal pain or shortness of breath. Due for CMET today  2 Hyperglycemia-  Has had this on last 2 blood draws.  A1c  Lab Results  Component Value Date   HGBA1C 6.1 09/08/2011  Pt denies polyuria or dipsia. Pt though has CRI and not a metformin candidate.   3.  Hx of osteopenia- Pt does take calcium, no Vit D, has not checked Vit D  Pt does have DJD but know trouble with walking or balance.    Preventative care: Patient will take a flu shot but she has declined a Pneumovax and Zostavax and colonoscopy today.   Review of Systems  Denies fever, chills, nausea vomiting abdominal pain, dysuria, chest pain, shortness of breath dyspnea on exertion or numbness in extremities Past medical history, social, surgical and family history all reviewed.     Objective:   Physical Exam  Vitals reviewed  Constitutional: She appears well-developed and well-nourished. No distress.  Neck: Normal range of motion. Neck supple.  Cardiovascular: Normal rate, regular rhythm and normal heart sounds.   No murmur heard. Pulmonary/Chest: Effort normal. No respiratory distress. She has no wheezes. She has no rales. She exhibits no tenderness.  Abdominal: Soft. Bowel sounds are normal. She exhibits no distension and no mass. There is no tenderness. There is no rebound and no guarding.  Musculoskeletal: Normal range of motion. She exhibits no edema and no tenderness.     Assessment & Plan:

## 2011-09-09 LAB — COMPREHENSIVE METABOLIC PANEL
AST: 14 U/L (ref 0–37)
Alkaline Phosphatase: 76 U/L (ref 39–117)
BUN: 28 mg/dL — ABNORMAL HIGH (ref 6–23)
Creat: 1.61 mg/dL — ABNORMAL HIGH (ref 0.50–1.10)
Glucose, Bld: 107 mg/dL — ABNORMAL HIGH (ref 70–99)
Potassium: 3.9 mEq/L (ref 3.5–5.3)
Total Bilirubin: 0.3 mg/dL (ref 0.3–1.2)

## 2011-09-09 LAB — VITAMIN D 25 HYDROXY (VIT D DEFICIENCY, FRACTURES): Vit D, 25-Hydroxy: 26 ng/mL — ABNORMAL LOW (ref 30–89)

## 2011-09-09 MED ORDER — ERGOCALCIFEROL 1.25 MG (50000 UT) PO CAPS: 50000.0000 [IU] | ORAL_CAPSULE | ORAL | Status: DC

## 2011-09-09 NOTE — Progress Notes (Signed)
Addended by: Judi Saa on: 09/09/2011 08:55 AM   Modules accepted: Orders

## 2011-11-09 ENCOUNTER — Telehealth: Payer: Self-pay | Admitting: Family Medicine

## 2011-11-09 NOTE — Telephone Encounter (Signed)
Pt dgt informed that most dental offices are pay out of pocket for Medicare as far as I know.  Pt states that mom used to go somewhere on [address].  Advised maybe that was [facility] and they could give her a little more info as they work with people with low income.  Pt dgt grateful. Brittin Belnap, Maryjo Rochester

## 2011-11-09 NOTE — Telephone Encounter (Signed)
Daughter is calling because her mom needs to go to a dentist and she isn't sure if she needs a referral or who the dentist was that she went to several years ago.

## 2012-02-24 ENCOUNTER — Ambulatory Visit (INDEPENDENT_AMBULATORY_CARE_PROVIDER_SITE_OTHER): Payer: Medicare Other | Admitting: Family Medicine

## 2012-02-24 VITALS — BP 158/83 | HR 80 | Temp 97.6°F | Ht 65.0 in | Wt 139.0 lb

## 2012-02-24 DIAGNOSIS — R197 Diarrhea, unspecified: Secondary | ICD-10-CM | POA: Insufficient documentation

## 2012-02-24 DIAGNOSIS — R12 Heartburn: Secondary | ICD-10-CM

## 2012-02-24 DIAGNOSIS — I10 Essential (primary) hypertension: Secondary | ICD-10-CM

## 2012-02-24 DIAGNOSIS — L820 Inflamed seborrheic keratosis: Secondary | ICD-10-CM

## 2012-02-24 DIAGNOSIS — N19 Unspecified kidney failure: Secondary | ICD-10-CM

## 2012-02-24 MED ORDER — OMEPRAZOLE 40 MG PO CPDR: 40.0000 mg | DELAYED_RELEASE_CAPSULE | Freq: Every day | ORAL | Status: DC

## 2012-02-24 MED ORDER — LOPERAMIDE HCL 2 MG PO CAPS: 2.0000 mg | ORAL_CAPSULE | Freq: Four times a day (QID) | ORAL | Status: AC | PRN

## 2012-02-24 NOTE — Patient Instructions (Addendum)
Thank you for coming in today. Please take all of your blood pressure medications as prescribed Please come back in 2 weeks for your blood pressure to be checked and blood work Please start taking imodium for your diarrhea. Come back if it gets worse Start taking your acid pill for 2 weeks Have a great day  Diarrhea Diarrhea is watery poop (stool). The most common cause of diarrhea is a germ. Other causes include:  Food poisoning.   A reaction to medicine.  HOME CARE   Drink clear fluids. This can stop you from losing too much body fluid (dehydration).   Drink enough fluids to keep your pee (urine) clear or pale yellow.   Avoid solid foods and dairy products until you start to feel better. Then start eating bland foods, such as:   Bananas.   Rice.   Crackers.   Applesauce.   Dry toast.   Avoid spicy foods, caffeine, and alcohol.   Your doctor may give medicine to help with cramps and watery poop. Take this as told. Avoid these medicines if you have a fever or blood in your poop.   Take your medicine as told. Finish them even if you start to feel better.  GET HELP RIGHT AWAY IF:   The watery poop lasts longer than 3 days.   You have a fever.   Your baby is older than 3 months with a rectal temperature of 100.5 F (38.1 C) or higher for more than 1 day.   There is blood in your poop.   You start to throw up (vomit).   You lose too much fluid.  MAKE SURE YOU:   Understand these instructions.   Will watch your condition.   Will get help right away if you are not doing well or get worse.  Document Released: 11/25/2007 Document Revised: 05/28/2011 Document Reviewed: 11/25/2007 [employer] Patient Information 2012 [place], [place].

## 2012-02-25 DIAGNOSIS — L989 Disorder of the skin and subcutaneous tissue, unspecified: Secondary | ICD-10-CM | POA: Insufficient documentation

## 2012-02-25 NOTE — Progress Notes (Signed)
  Subjective:    Patient ID: Graciella Freer, female    DOB: Oct 08, 1923, 78 y.o.   MRN: [id number]  HPI  CC: Hrt burn, Diarrhea, Head sore  Heart burn: onset 2wks. Worse after meals and with ASA 81mg . No alleviating factors, no medications. Increased stress over past several weeks. FmHx of reflux but no GI cancer. Denies CO, SOB, diaphoresis  HTN: DId not take carvedilol today. Allergic to ACEi (angioedema). Denies palpitations, CP, lightheadedness, syncope.  Diarrhea: ongonig for 2 wks. Denies sick contacts. Approximately BID BM that are non-bloody, nonmalodorous. No recent infections requiring ABX. Denies fever, n/v/c, lightheadedness, rash, abd pain  L temple lesion. Enlarging L temple skin tag per pt that is becoming more painful and irritated. Lesion is new over the past several months. Scratches the lesion for relief.    Review of Systems Per HPI    Objective:   Physical Exam GEN: NAD CV: RRR no m/r/g ABD: NABS, non-painful to palpation SKin: Numerous SK on head L Temple w/ inflammed SK       Assessment & Plan:

## 2012-02-25 NOTE — Assessment & Plan Note (Signed)
Pt to return in 2 wks for BP check. ACE not an option

## 2012-02-25 NOTE — Assessment & Plan Note (Signed)
Liquid nitrogen. Pt to f/u in 2 wks

## 2012-02-25 NOTE — Assessment & Plan Note (Signed)
Reviewed labs. No recent data. Repeat BMET

## 2012-02-25 NOTE — Assessment & Plan Note (Signed)
Likely viral. Handout given. Fluids and imodium. Return for further workup if not improving or worsens. Red flags discussed

## 2012-02-25 NOTE — Assessment & Plan Note (Addendum)
Omeprazole for 2 wks. May need H. Pylori testing. Pt to avoid triggers

## 2012-03-09 ENCOUNTER — Telehealth: Payer: Self-pay | Admitting: Family Medicine

## 2012-03-09 DIAGNOSIS — R12 Heartburn: Secondary | ICD-10-CM

## 2012-03-09 DIAGNOSIS — R197 Diarrhea, unspecified: Secondary | ICD-10-CM

## 2012-03-09 DIAGNOSIS — L820 Inflamed seborrheic keratosis: Secondary | ICD-10-CM

## 2012-03-09 NOTE — Assessment & Plan Note (Signed)
Resolving. Very infrequent at this time

## 2012-03-09 NOTE — Assessment & Plan Note (Signed)
Resolved as of 03/09/12

## 2012-03-09 NOTE — Telephone Encounter (Signed)
Called and spoke to pt by phone. Pt very happy w/ results from last appt.

## 2012-03-09 NOTE — Assessment & Plan Note (Signed)
SK has fallen off and skin is healing well. No complaints per pt

## 2012-03-10 ENCOUNTER — Ambulatory Visit (INDEPENDENT_AMBULATORY_CARE_PROVIDER_SITE_OTHER): Payer: Medicare Other | Admitting: Family Medicine

## 2012-03-10 ENCOUNTER — Encounter: Payer: Self-pay | Admitting: Family Medicine

## 2012-03-10 VITALS — BP 169/74 | HR 81 | Temp 98.1°F | Ht 64.75 in | Wt 143.3 lb

## 2012-03-10 DIAGNOSIS — L820 Inflamed seborrheic keratosis: Secondary | ICD-10-CM

## 2012-03-10 DIAGNOSIS — M949 Disorder of cartilage, unspecified: Secondary | ICD-10-CM

## 2012-03-10 DIAGNOSIS — N19 Unspecified kidney failure: Secondary | ICD-10-CM

## 2012-03-10 DIAGNOSIS — M899 Disorder of bone, unspecified: Secondary | ICD-10-CM

## 2012-03-10 DIAGNOSIS — Z23 Encounter for immunization: Secondary | ICD-10-CM

## 2012-03-10 DIAGNOSIS — I10 Essential (primary) hypertension: Secondary | ICD-10-CM

## 2012-03-10 DIAGNOSIS — R12 Heartburn: Secondary | ICD-10-CM

## 2012-03-10 DIAGNOSIS — R197 Diarrhea, unspecified: Secondary | ICD-10-CM

## 2012-03-11 LAB — COMPREHENSIVE METABOLIC PANEL
ALT: 8 U/L (ref 0–35)
AST: 12 U/L (ref 0–37)
Albumin: 3.9 g/dL (ref 3.5–5.2)
Calcium: 9.1 mg/dL (ref 8.4–10.5)
Chloride: 108 mEq/L (ref 96–112)
Potassium: 4.1 mEq/L (ref 3.5–5.3)
Total Protein: 6.9 g/dL (ref 6.0–8.3)

## 2012-03-15 ENCOUNTER — Telehealth: Payer: Self-pay | Admitting: Family Medicine

## 2012-03-15 ENCOUNTER — Encounter: Payer: Self-pay | Admitting: Family Medicine

## 2012-03-15 DIAGNOSIS — I10 Essential (primary) hypertension: Secondary | ICD-10-CM

## 2012-03-15 MED ORDER — HYDRALAZINE HCL 10 MG PO TABS: 10.0000 mg | ORAL_TABLET | Freq: Three times a day (TID) | ORAL | Status: DC

## 2012-03-15 NOTE — Assessment & Plan Note (Signed)
Still healing. Much improved. PT happy w/ results thus far. Aware of possible permenant skin blanching

## 2012-03-15 NOTE — Assessment & Plan Note (Addendum)
BP not at goal.  Chronic issue for pt w/ likely cardiorenal syndrome Maxed out on current therapy. Will consider lasix vs HCTX vs spironolactone after labwork

## 2012-03-15 NOTE — Patient Instructions (Signed)
PT instructions given

## 2012-03-15 NOTE — Assessment & Plan Note (Signed)
Vitamin D level today -- Addendum--- normal

## 2012-03-15 NOTE — Assessment & Plan Note (Signed)
Much improved. To conitnue Imodium PRN

## 2012-03-15 NOTE — Telephone Encounter (Signed)
Message copied by Ozella Rocks on Tue Mar 15, 2012 12:13 PM ------      Message from: [facility], Carnelia Oscar J      Created: Tue Mar 15, 2012  6:26 AM       referr to renal

## 2012-03-15 NOTE — Assessment & Plan Note (Addendum)
CMET today Likely Cardiorenal component. Cr elevated w/ GFR in AAF estimated to be 37. Will call pt concerning referral to REnal for initial evaluation and recommendations for BP control

## 2012-03-15 NOTE — Assessment & Plan Note (Signed)
Spoke to pt by phone, SBP 177 at pharmacy.  Adding Hydralazine Appreciate Renal recommendation on diuretic use as renal function declines

## 2012-03-15 NOTE — Progress Notes (Signed)
  Subjective:    Patient ID: Christine Armstrong, female    DOB: 06/28/1923, 78 y.o.   MRN: [id number]  HPI CC: Temple lesion, diarrhea f/u  DIarrhea: Much improved, denies HA, syncope, n/v. Continues to take imodium  HTN: elevated today in clinic, Deneis HA, CP, SOB. Does not check at home. Pt allergic to ACE/ARB.  Inflammed SK: Larey Seat off after cryo therapy. Feels fine today. Very happy w/ results   Review of Systems Per HPI    Objective:   Physical Exam  GEN: NAD CV: RRR no m/r/g ABD: NABS, non-painful to palpation SKin: Helaing SK of L temple. Minimal skin blanching.       Assessment & Plan:

## 2012-03-15 NOTE — Telephone Encounter (Signed)
Spoke to pt and granddaughter over phone. Pt to be referred to renal due to progressive renal decline.  Starting Hydralazine for continued HTN.

## 2012-03-15 NOTE — Assessment & Plan Note (Signed)
Resolved w/ 2wk trial PPI. WIll DC for now

## 2012-03-15 NOTE — Addendum Note (Signed)
Addended by: Ozella Rocks on: 03/15/2012 06:26 AM   Modules accepted: Orders

## 2012-06-23 ENCOUNTER — Other Ambulatory Visit: Payer: Self-pay | Admitting: *Deleted

## 2012-06-28 MED ORDER — CLONIDINE HCL 0.2 MG PO TABS: 0.2000 mg | ORAL_TABLET | Freq: Two times a day (BID) | ORAL | Status: DC

## 2012-06-29 ENCOUNTER — Telehealth: Payer: Self-pay | Admitting: Family Medicine

## 2012-06-29 MED ORDER — CLONIDINE HCL 0.2 MG PO TABS: 0.2000 mg | ORAL_TABLET | Freq: Two times a day (BID) | ORAL | Status: DC

## 2012-06-29 NOTE — Telephone Encounter (Signed)
Refill for Catapress has been requested by [facility] on [address], but they have not heard back and she is out and has been out for a few days.

## 2012-06-29 NOTE — Telephone Encounter (Signed)
Rx was set on print instead of normal.. Resent Catapres prescription to [facility], [address] Ileana Ladd

## 2012-07-08 ENCOUNTER — Encounter: Payer: Self-pay | Admitting: Family Medicine

## 2012-07-08 ENCOUNTER — Ambulatory Visit (INDEPENDENT_AMBULATORY_CARE_PROVIDER_SITE_OTHER): Payer: Medicaid Other | Admitting: Family Medicine

## 2012-07-08 VITALS — BP 133/75 | HR 80 | Temp 99.0°F | Ht 65.0 in | Wt 137.0 lb

## 2012-07-08 DIAGNOSIS — I10 Essential (primary) hypertension: Secondary | ICD-10-CM

## 2012-07-08 DIAGNOSIS — L820 Inflamed seborrheic keratosis: Secondary | ICD-10-CM

## 2012-07-08 MED ORDER — AMLODIPINE BESYLATE 10 MG PO TABS: 10.0000 mg | ORAL_TABLET | Freq: Every day | ORAL | Status: DC

## 2012-07-08 MED ORDER — HYDRALAZINE HCL 10 MG PO TABS: 10.0000 mg | ORAL_TABLET | Freq: Three times a day (TID) | ORAL | Status: DC

## 2012-07-08 MED ORDER — CARVEDILOL 25 MG PO TABS: 25.0000 mg | ORAL_TABLET | Freq: Two times a day (BID) | ORAL | Status: DC

## 2012-07-08 NOTE — Progress Notes (Signed)
Christine Armstrong is a 79 y.o. female who presents to [facility] today for HTN f/u and face lesion  HTN: Becomes lightlheaded when taking clonidine. Denies CP, SOB, syncope, palpitations, HA.   SKin lesion: starting to regrow. Bumps often and can be painful.  The following portions of the patient's history were reviewed and updated as appropriate: allergies, current medications, past medical history, family and social history, and problem list.  Patient is a nonsmoker.   Past Medical History  Diagnosis Date  . CKD (chronic kidney disease) stage 3, GFR 30-59 ml/min   . HTN (hypertension)   . Benign skin lesion   . Osteoporosis     ROS as above otherwise neg.    Medications reviewed. Current Outpatient Prescriptions  Medication Sig Dispense Refill  . amLODipine (NORVASC) 10 MG tablet Take 1 tablet (10 mg total) by mouth daily.  90 tablet  3  . aspirin 81 MG tablet Take 81 mg by mouth daily.        . carvedilol (COREG) 25 MG tablet Take 1 tablet (25 mg total) by mouth 2 (two) times daily with a meal.  180 tablet  3  . cloNIDine (CATAPRES) 0.2 MG tablet Take 1 tablet (0.2 mg total) by mouth 2 (two) times daily.  180 tablet  3  . hydrALAZINE (APRESOLINE) 10 MG tablet Take 1 tablet (10 mg total) by mouth 3 (three) times daily.  90 tablet  0    Exam: BP 133/75  Pulse 80  Temp 99 F (37.2 C) (Oral)  Ht 5\' 5"  (1.651 m)  Wt 137 lb (62.143 kg)  BMI 22.80 kg/m2 Gen: Well NAD HEENT: EOMI,  MMM Skin: small 1cm pedunculated skin lesion on L cheek that is scaly in nature  No results found for this or any previous visit (from the past 72 hour(s)).

## 2012-07-08 NOTE — Patient Instructions (Addendum)
Thank you for coming in today Please stop your clonidine Please come back in 2 weeks for either a blood pressure check or to see me for removal of the skin bump on your face Have a blessed weekend

## 2012-07-08 NOTE — Assessment & Plan Note (Signed)
Continues to cause pain and discomfort.  Different from initial presentation. Now somewhat pedunculated and considerably smaller than original lesion.  Not malignant Will defer treatment until next appt per pt.  Will plan on shave excision

## 2012-07-08 NOTE — Assessment & Plan Note (Signed)
Still urinating No complaints.  Reviewed previous Cr. Will repeat BMP at next appt

## 2012-07-08 NOTE — Assessment & Plan Note (Signed)
At goal.  Concerned about hypotension Stop Clonidine Recheck BP in 2 wks

## 2012-07-25 ENCOUNTER — Ambulatory Visit (INDEPENDENT_AMBULATORY_CARE_PROVIDER_SITE_OTHER): Payer: Medicare Other | Admitting: Family Medicine

## 2012-07-25 ENCOUNTER — Encounter: Payer: Self-pay | Admitting: Family Medicine

## 2012-07-25 VITALS — BP 156/75 | HR 82 | Ht 65.0 in | Wt 136.0 lb

## 2012-07-25 DIAGNOSIS — L989 Disorder of the skin and subcutaneous tissue, unspecified: Secondary | ICD-10-CM

## 2012-07-25 DIAGNOSIS — R1013 Epigastric pain: Secondary | ICD-10-CM

## 2012-07-25 DIAGNOSIS — K30 Functional dyspepsia: Secondary | ICD-10-CM

## 2012-07-25 DIAGNOSIS — I10 Essential (primary) hypertension: Secondary | ICD-10-CM

## 2012-07-25 NOTE — Progress Notes (Signed)
Christine Armstrong is a 79 y.o. female who presents to [facility] today for upset stomach, HTN.  Upset stomach: started 2 days ago w/ upset stomach and diarrhea after meals. Occurs approximately 2x daily w/ 4 total BM saily. No sick contacts, not other family members in the home w/ symptoms. Never had symptoms before. Denies fever, constipation, rash, syncope, lightheadedness  HTN: Feels well since stopping the clonidine. Denies CP, HA, palpitations.   Skin lesion: no further trauma or irritation. Does not desire removal or further treatmtent at this time.   The following portions of the patient's history were reviewed and updated as appropriate: allergies, current medications, past medical history, family and social history, and problem list.  Patient is a nonsmoker    Past Medical History  Diagnosis Date  . CKD (chronic kidney disease) stage 3, GFR 30-59 ml/min   . HTN (hypertension)   . Benign skin lesion   . Osteoporosis     ROS as above otherwise neg.    Medications reviewed. Current Outpatient Prescriptions  Medication Sig Dispense Refill  . amLODipine (NORVASC) 10 MG tablet Take 1 tablet (10 mg total) by mouth daily.  90 tablet  3  . aspirin 81 MG tablet Take 81 mg by mouth daily.        . carvedilol (COREG) 25 MG tablet Take 1 tablet (25 mg total) by mouth 2 (two) times daily with a meal.  180 tablet  3  . hydrALAZINE (APRESOLINE) 10 MG tablet Take 1 tablet (10 mg total) by mouth 3 (three) times daily.  90 tablet  0    Exam:  BP 156/75  Pulse 82  Ht 5\' 5"  (1.651 m)  Wt 136 lb (61.689 kg)  BMI 22.63 kg/m2 Gen: Well NAD HEENT: EOMI,  MMM Lungs: CTABL Nl WOB Heart: RRR no MRG Abd: NABS, mild tenderness in the RLQ diffusely, non-tender at McBurny's point, negative for Murphy's Skin: 0.5cm pedunculated fleshy mole of the L cheek. No erythema  Exts: Non edematous BL  LE, warm and well perfused.   No results found for this or any previous visit (from the past 72 hour(s)).

## 2012-07-25 NOTE — Patient Instructions (Addendum)
Thank you for coming in today You are doing very well.  Please continue to take you medications as prescribed Please add more  yogurts, cheeses and milk to your diet over the next few days. I think your symptoms are either caused by a virus or from irritation from something you ate Please start taking imodium for relief of your diarrhea.  Please come back if you notice any blood or develop fever. If your symptoms worsen or do not improve come back to see me  Diarrhea Infections caused by germs (bacterial) or a virus commonly cause diarrhea. Your caregiver has determined that with time, rest and fluids, the diarrhea should improve. In general, eat normally while drinking more water than usual. Although water may prevent dehydration, it does not contain salt and minerals (electrolytes). Broths, weak tea without caffeine and oral rehydration solutions (ORS) replace fluids and electrolytes. Small amounts of fluids should be taken frequently. Large amounts at one time may not be tolerated. Plain water may be harmful in infants and the elderly. Oral rehydrating solutions (ORS) are available at pharmacies and grocery stores. ORS replace water and important electrolytes in proper proportions. Sports drinks are not as effective as ORS and may be harmful due to sugars worsening diarrhea.  ORS is especially recommended for use in children with diarrhea. As a general guideline for children, replace any new fluid losses from diarrhea and/or vomiting with ORS as follows:  If your child weighs 22 pounds or under (10 kg or less), give 60-120 mL ( -  cup or 2 - 4 ounces) of ORS for each episode of diarrheal stool or vomiting episode.  If your child weighs more than 22 pounds (more than 10 kgs), give 120-240 mL ( - 1 cup or 4 - 8 ounces) of ORS for each diarrheal stool or episode of vomiting.  While correcting for dehydration, children should eat normally. However, foods high in sugar should be avoided because  this may worsen diarrhea. Large amounts of carbonated soft drinks, juice, gelatin desserts and other highly sugared drinks should be avoided.  After correction of dehydration, other liquids that are appealing to the child may be added. Children should drink small amounts of fluids frequently and fluids should be increased as tolerated. Children should drink enough fluids to keep urine clear or pale yellow.  Adults should eat normally while drinking more fluids than usual. Drink small amounts of fluids frequently and increase as tolerated. Drink enough fluids to keep urine clear or pale yellow. Broths, weak decaffeinated tea, lemon lime soft drinks (allowed to go flat) and ORS replace fluids and electrolytes.  Avoid:  Carbonated drinks.  Juice.  Extremely hot or cold fluids.  Caffeine drinks.  Fatty, greasy foods.  Alcohol.  Tobacco.  Too much intake of anything at one time.  Gelatin desserts.  Probiotics are active cultures of beneficial bacteria. They may lessen the amount and number of diarrheal stools in adults. Probiotics can be found in yogurt with active cultures and in supplements.  Wash hands well to avoid spreading bacteria and virus.  Anti-diarrheal medications are not recommended for infants and children.  Only take over-the-counter or prescription medicines for pain, discomfort or fever as directed by your caregiver. Do not give aspirin to children because it may cause Reye's Syndrome.  For adults, ask your caregiver if you should continue all prescribed and over-the-counter medicines.  If your caregiver has given you a follow-up appointment, it is very important to keep that appointment. Not keeping  the appointment could result in a chronic or permanent injury, and disability. If there is any problem keeping the appointment, you must call back to this facility for assistance. SEEK IMMEDIATE MEDICAL CARE IF:   You or your child is unable to keep fluids down or other  symptoms or problems become worse in spite of treatment.  Vomiting or diarrhea develops and becomes persistent.  There is vomiting of blood or bile (green material).  There is blood in the stool or the stools are black and tarry.  There is no urine output in 6-8 hours or there is only a small amount of very dark urine.  Abdominal pain develops, increases or localizes.  You have a fever.  Your baby is older than 3 months with a rectal temperature of 102 F (38.9 C) or higher.  Your baby is 22 months old or younger with a rectal temperature of 100.4 F (38 C) or higher.  You or your child develops excessive weakness, dizziness, fainting or extreme thirst.  You or your child develops a rash, stiff neck, severe headache or become irritable or sleepy and difficult to awaken. MAKE SURE YOU:   Understand these instructions.  Will watch your condition.  Will get help right away if you are not doing well or get worse. Document Released: 05/29/2002 Document Revised: 08/31/2011 Document Reviewed: 04/15/2009 [facility] Patient Information 2013 [place], [place].

## 2012-07-26 ENCOUNTER — Encounter: Payer: Self-pay | Admitting: Family Medicine

## 2012-07-26 DIAGNOSIS — K30 Functional dyspepsia: Secondary | ICD-10-CM | POA: Insufficient documentation

## 2012-07-26 NOTE — Assessment & Plan Note (Addendum)
Viral gastro vs irriation from spoiled food or other foods vs cholelithiasis Imodium. Increase yogurt/milk/cheese in diet Journal of foods causing symptoms If symptoms worsen or do not improve will likely need imaging study

## 2012-07-26 NOTE — Assessment & Plan Note (Signed)
Elevated today but less than 160. Continue current regimen. Will add clonidine back if becomes elevated above 160. No evidence of hypotensive episodes that were a concern at last appt.

## 2012-07-26 NOTE — Assessment & Plan Note (Signed)
No further intervention desired at this time.  If becomes inflammed or painful again in the future will pursue removal.

## 2012-07-27 ENCOUNTER — Other Ambulatory Visit: Payer: Self-pay | Admitting: Nephrology

## 2012-07-27 DIAGNOSIS — I10 Essential (primary) hypertension: Secondary | ICD-10-CM

## 2012-08-12 ENCOUNTER — Ambulatory Visit
Admission: RE | Admit: 2012-08-12 | Discharge: 2012-08-12 | Disposition: A | Discharge status: Home / Self Care | Payer: Medicare Other | Source: Ambulatory Visit | Attending: Nephrology | Admitting: Nephrology

## 2012-08-12 DIAGNOSIS — I10 Essential (primary) hypertension: Secondary | ICD-10-CM

## 2012-09-12 ENCOUNTER — Other Ambulatory Visit: Payer: Self-pay | Admitting: *Deleted

## 2012-09-12 MED ORDER — HYDRALAZINE HCL 10 MG PO TABS: 10.0000 mg | ORAL_TABLET | Freq: Three times a day (TID) | ORAL | Status: DC

## 2012-11-28 ENCOUNTER — Ambulatory Visit (INDEPENDENT_AMBULATORY_CARE_PROVIDER_SITE_OTHER): Payer: Medicare Other | Admitting: Emergency Medicine

## 2012-11-28 ENCOUNTER — Ambulatory Visit: Payer: Medicare Other

## 2012-11-28 VITALS — BP 153/69 | HR 68 | Temp 97.9°F | Resp 16 | Ht 62.0 in | Wt 130.0 lb

## 2012-11-28 DIAGNOSIS — S82401A Unspecified fracture of shaft of right fibula, initial encounter for closed fracture: Secondary | ICD-10-CM

## 2012-11-28 DIAGNOSIS — M25579 Pain in unspecified ankle and joints of unspecified foot: Secondary | ICD-10-CM

## 2012-11-28 DIAGNOSIS — M25571 Pain in right ankle and joints of right foot: Secondary | ICD-10-CM

## 2012-11-28 DIAGNOSIS — S82409A Unspecified fracture of shaft of unspecified fibula, initial encounter for closed fracture: Secondary | ICD-10-CM

## 2012-11-28 MED ORDER — HYDROCODONE-ACETAMINOPHEN 5-325 MG PO TABS: 1.0000 | ORAL_TABLET | ORAL | Status: DC | PRN

## 2012-11-28 NOTE — Patient Instructions (Addendum)
Fibular Fracture, Ankle, Adult, Treated with or without Immobilization  You have a fracture (break) of your fibula. This is the bone in your lower leg located on the outside of the leg. These fractures are easily diagnosed with x-rays.  TREATMENT   You have a simple fracture of the part of the fibula which is located between the knee and ankle. This bone usually will heal without problems and can often be treated without casting or splinting. This means the fracture will heal well during normal use and daily activities without being held in place. Sometimes a cast or splint is placed on these fractures if it is needed for comfort or if the bones are badly out of place.  HOME CARE INSTRUCTIONS    Apply ice to the injury for 15-20 minutes, 3-4 times per day while awake, for 2 days. Put the ice in a plastic bag and place a thin towel between the bag of ice and your leg. This helps keep swelling down.   Use crutches as directed. Resume walking without crutches as directed by your caregiver or when comfortable doing so.   Only take over-the-counter or prescription medicines for pain, discomfort, or fever as directed by your caregiver.   Keep appointments for follow up x-rays if these are required.   If you have a removable splint or boot, do not remove the boot unless directed by your caregiver.   Warning: Do not drive a car or operate a motor vehicle until your caregiver specifically tells you it is safe to do so.  SEEK MEDICAL CARE IF:    You have continued severe pain or more swelling   The medications do not control the pain.   Your skin or nails below the injury turn blue or grey or feel cold or numb.   You develop severe pain in the leg or foot.  MAKE SURE YOU:    Understand these instructions.   Will watch your condition.   Will get help right away if you are not doing well or get worse.  Document Released: 06/08/2005 Document Revised: 08/31/2011 Document Reviewed: 01/13/2008  [employer] Patient  Information 2014 [employer].

## 2012-11-28 NOTE — Progress Notes (Signed)
[facility] [address], [place] [address] [phone]  Date:  11/28/2012   Name:  Christine Armstrong   DOB:  03-05-1924   MRN:  [id number]  PCP:  Shelly Flatten, MD    Chief Complaint: Ankle Injury   History of Present Illness:  Christine Armstrong is a 79 y.o. very pleasant female patient who presents with the following:  Stepping through a door and injured her right ankle.  Unable to bear weight on her ankle and has pain and swelling in the right ankle. No recollection of a distinct injury.  No improvement with over the counter medications or other home remedies. Denies other complaint or health concern today. Pain worse over course of day.  Patient Active Problem List   Diagnosis Date Noted  . Upset stomach 07/26/2012  . Skin lesion of cheek 02/25/2012  . Hyperglycemia 09/08/2011  . HYPERTENSION, BENIGN SYSTEMIC 08/19/2006  . CKD (chronic kidney disease) stage 3, GFR 30-59 ml/min 08/19/2006  . OSTEOPENIA 08/19/2006    Past Medical History  Diagnosis Date  . CKD (chronic kidney disease) stage 3, GFR 30-59 ml/min   . HTN (hypertension)   . Benign skin lesion   . Osteoporosis     History reviewed. No pertinent past surgical history.  History  Substance Use Topics  . Smoking status: Never Smoker   . Smokeless tobacco: Never Used  . Alcohol Use: No    Family History  Problem Relation Age of Onset  . Hypertension Brother     Allergies  Allergen Reactions  . Ace Inhibitors     REACTION: angioedema    Medication list has been reviewed and updated.  Current Outpatient Prescriptions on File Prior to Visit  Medication Sig Dispense Refill  . amLODipine (NORVASC) 10 MG tablet Take 1 tablet (10 mg total) by mouth daily.  90 tablet  3  . aspirin 81 MG tablet Take 81 mg by mouth daily.        . carvedilol (COREG) 25 MG tablet Take 1 tablet (25 mg total) by mouth 2 (two) times daily with a meal.  180 tablet  3  . hydrALAZINE (APRESOLINE) 10 MG tablet  Take 1 tablet (10 mg total) by mouth 3 (three) times daily.  90 tablet  0   No current facility-administered medications on file prior to visit.    Review of Systems:  As per HPI, otherwise negative.    Physical Examination: Filed Vitals:   11/28/12 1718  BP: 153/69  Pulse: 68  Temp: 97.9 F (36.6 C)  Resp: 16   Filed Vitals:   11/28/12 1718  Height: 5\' 2"  (1.575 m)  Weight: 130 lb (58.968 kg)   Body mass index is 23.77 kg/(m^2). Ideal Body Weight: Weight in (lb) to have BMI = 25: 136.4   GEN: WDWN, NAD, Non-toxic, Alert & Oriented x 3 HEENT: Atraumatic, Normocephalic.  Ears and Nose: No external deformity. EXTR: No clubbing/cyanosis/edema.  Marked swelling and tender ankle.  No deformity or ecchymosis. NEURO: wheelchair bound  PSYCH: Normally interactive. Conversant. Not depressed or anxious appearing.  Calm demeanor.    Assessment and Plan: Fracture distal fibula Boot vicodin Ortho referral   Signed,  Phillips Odor, MD   [facility] reading (PRIMARY) by  Dr. Dareen Piano.  Fracture distal fibula.

## 2012-12-02 ENCOUNTER — Other Ambulatory Visit: Payer: Self-pay | Admitting: *Deleted

## 2012-12-02 MED ORDER — HYDRALAZINE HCL 10 MG PO TABS: 10.0000 mg | ORAL_TABLET | Freq: Three times a day (TID) | ORAL | Status: DC

## 2012-12-05 ENCOUNTER — Other Ambulatory Visit: Payer: Self-pay | Admitting: *Deleted

## 2012-12-06 MED ORDER — HYDRALAZINE HCL 10 MG PO TABS: 10.0000 mg | ORAL_TABLET | Freq: Three times a day (TID) | ORAL | Status: DC

## 2013-01-06 ENCOUNTER — Encounter: Payer: Self-pay | Admitting: Family Medicine

## 2013-01-06 ENCOUNTER — Ambulatory Visit (INDEPENDENT_AMBULATORY_CARE_PROVIDER_SITE_OTHER): Payer: Medicare Other | Admitting: Family Medicine

## 2013-01-06 VITALS — BP 157/63 | HR 80 | Temp 97.7°F | Wt 127.0 lb

## 2013-01-06 DIAGNOSIS — M949 Disorder of cartilage, unspecified: Secondary | ICD-10-CM

## 2013-01-06 DIAGNOSIS — I10 Essential (primary) hypertension: Secondary | ICD-10-CM

## 2013-01-06 DIAGNOSIS — L989 Disorder of the skin and subcutaneous tissue, unspecified: Secondary | ICD-10-CM

## 2013-01-06 DIAGNOSIS — L909 Atrophic disorder of skin, unspecified: Secondary | ICD-10-CM

## 2013-01-06 DIAGNOSIS — M899 Disorder of bone, unspecified: Secondary | ICD-10-CM

## 2013-01-06 NOTE — Assessment & Plan Note (Signed)
Likely becoming hypotensive w/ hydralazine.  No hydralazine taken today Family members in health care Stop Hydralazine w/ frequent checks of BP. If greater than SBP 160s or symptomatic restart hydralazine 50mg  TID Discussed risks and benefits, but given pt w/ osteopenia and recent fracture, she is at tremendous wrisk of fall w/ hip fracture which could be deadly for this pt. Will air on the side of HTN

## 2013-01-06 NOTE — Assessment & Plan Note (Signed)
Recent fib fracture Start Vit D 1000 units daily (will likely need high dose replacement) and Calcium 800mg  Daily Vit D level today.

## 2013-01-06 NOTE — Progress Notes (Signed)
Christine Armstrong is a 79 y.o. female who presents to [facility] today for dizziness.  Dizzy when taking hydralazine 100mg  TID. Occurs wen going sitting/laying to standing only when taking hydralazine. When off, symptoms do not occur. Taking amlodipine and carvedilol w/o adverse side effects. Denies syncope, vertigo, CP, palpitations  HTN: Taking meds as prescribed but symptomatic orhtostasis  Skin Lesion: regrowing. intermittenty irritated primarily from trauma  Neck tumor: no additional growth. Present for years. Denies any fevers, night sweats, unintentional wt loss.   Leg fracture: fractured from walking into door. Treated and in a boot today. No further ocmplaints. H/o osteopenia.    The following portions of the patient's history were reviewed and updated as appropriate: allergies, current medications, past medical history, family and social history, and problem list.  Patient is a nonsmoker   Past Medical History  Diagnosis Date  . CKD (chronic kidney disease) stage 3, GFR 30-59 ml/min   . HTN (hypertension)   . Benign skin lesion   . Osteoporosis     ROS as above otherwise neg.    Medications reviewed. Current Outpatient Prescriptions  Medication Sig Dispense Refill  . amLODipine (NORVASC) 10 MG tablet Take 1 tablet (10 mg total) by mouth daily.  90 tablet  3  . aspirin 81 MG tablet Take 81 mg by mouth daily.        . carvedilol (COREG) 25 MG tablet Take 1 tablet (25 mg total) by mouth 2 (two) times daily with a meal.  180 tablet  3  . hydrALAZINE (APRESOLINE) 10 MG tablet Take 1 tablet (10 mg total) by mouth 3 (three) times daily.  90 tablet  0  . HYDROcodone-acetaminophen (NORCO) 5-325 MG per tablet Take 1-2 tablets by mouth every 4 (four) hours as needed for pain.  30 tablet  0   No current facility-administered medications for this visit.    Exam:  BP 157/63  Pulse 80  Temp(Src) 97.7 F (36.5 C) (Oral)  Wt 127 lb (57.607 kg)  BMI 23.22 kg/m2 Gen: Well NAD HEENT: EOMI,   MMM Skin: hyperkaritotic pedunculated tag located on cheek.   No results found for this or any previous visit (from the past 72 hour(s)).   PROCEDURE NOTE  PRE-OP DIAGNOSIS:  hyperkaratotic pedunculated skin tag  PROCEDURE:  Skin Lesion Excision(s)  INDICATIONS:  ALONNAH Armstrong is a 79 y.o. female who presents for minor skin surgery.  The patient understands all risks, benefits, indications, potential complications, and alternatives, and freely consents for the procedure.  The patient also understands the option of performing no surgery, the risk for scarring, and the technique of the procedure.  ANESTHESIA:  Local.  TECHNIQUE:  After informed consent was obtained, and after the skin was prepped and draped, 1% lidocaine with epinephrine for anesthetic was injected around and underneath the site.   electrocautery and 15 blade superficial excision was performed.  Bacitracin ointment and a dressing was applied and wound care instructions were provided.  Verdine tolerated the procedure well and without complications.  The patient will be alert for any signs of cutaneous infection and will follow up as instructed.  Indication: recurrent trauma, inflammation and infection of lesion. Recurrence after cryotherapy.   Shelly Flatten, MD Family Medicine PGY-3 01/06/2013, 12:53 PM

## 2013-01-06 NOTE — Assessment & Plan Note (Signed)
Excision w/ 15 blade at the bottom o f the stock Electrocartery of the base for cell destruction adn hemostasis No path required.

## 2013-01-06 NOTE — Patient Instructions (Addendum)
We took off your skin growth today Please keep you bandaid on until tomorrow then wash as normal If it becomes infected call me as you will need antibiotics Please stop taking your hydralazine and check your blood pressure several times a day If you are above 160 systolic then please consider taking half of your hydralazine dose (50mg  three times a day) Please come back to see me in 4 weeks to follow up on your other medical conditions Please start taking Vitamin D3 1000 units daily and Calcium 800mg  daily.

## 2013-01-07 LAB — VITAMIN D 25 HYDROXY (VIT D DEFICIENCY, FRACTURES): Vit D, 25-Hydroxy: 32 ng/mL (ref 30–89)

## 2013-02-08 ENCOUNTER — Other Ambulatory Visit: Payer: Self-pay | Admitting: *Deleted

## 2013-02-08 ENCOUNTER — Encounter: Payer: Self-pay | Admitting: Family Medicine

## 2013-02-08 ENCOUNTER — Ambulatory Visit (INDEPENDENT_AMBULATORY_CARE_PROVIDER_SITE_OTHER): Payer: Medicare Other | Admitting: Family Medicine

## 2013-02-08 VITALS — BP 144/71 | HR 80 | Temp 98.1°F | Wt 122.5 lb

## 2013-02-08 DIAGNOSIS — I10 Essential (primary) hypertension: Secondary | ICD-10-CM

## 2013-02-08 DIAGNOSIS — L989 Disorder of the skin and subcutaneous tissue, unspecified: Secondary | ICD-10-CM

## 2013-02-08 DIAGNOSIS — M899 Disorder of bone, unspecified: Secondary | ICD-10-CM

## 2013-02-08 MED ORDER — AMLODIPINE BESYLATE 5 MG PO TABS: 5.0000 mg | ORAL_TABLET | Freq: Every day | ORAL | Status: DC

## 2013-02-08 NOTE — Assessment & Plan Note (Signed)
Will decrease Amlodipine to 5mg  daily. Continue carvedilol at 25mg  daily (HR 80s) H/o gout so no HCTZ

## 2013-02-08 NOTE — Progress Notes (Signed)
Christine Armstrong is a 79 y.o. female who presents to [facility] today for f/u for   Upset stomach. Very hungry. occures w/ random foods such as chicken soup and oatmeal. BM 2x daily. Associated w/ heart burn after meals. Denies sick contacts. Denies n/v SOB, CP, palpitations. No burnign at night when supine.   HTN: taking carvedilol and amlodipine. Not checking home BPs. ROS as above. Has hydralazine at home but not using.   CKD: established w/ [place] kidney. Still making urine.   The following portions of the patient's history were reviewed and updated as appropriate: allergies, current medications, past medical history, family and social history, and problem list.  Patient is a nonsmoker.  Past Medical History  Diagnosis Date  . CKD (chronic kidney disease) stage 3, GFR 30-59 ml/min   . HTN (hypertension)   . Benign skin lesion   . Osteoporosis     ROS as above otherwise neg.    Medications reviewed. Current Outpatient Prescriptions  Medication Sig Dispense Refill  . amLODipine (NORVASC) 10 MG tablet Take 1 tablet (10 mg total) by mouth daily.  90 tablet  3  . aspirin 81 MG tablet Take 81 mg by mouth daily.        . carvedilol (COREG) 25 MG tablet Take 1 tablet (25 mg total) by mouth 2 (two) times daily with a meal.  180 tablet  3  . hydrALAZINE (APRESOLINE) 10 MG tablet Take 1 tablet (10 mg total) by mouth 3 (three) times daily.  90 tablet  0  . HYDROcodone-acetaminophen (NORCO) 5-325 MG per tablet Take 1-2 tablets by mouth every 4 (four) hours as needed for pain.  30 tablet  0   No current facility-administered medications for this visit.    Exam: BP 144/71  Pulse 80  Temp(Src) 98.1 F (36.7 C) (Oral)  Wt 122 lb 8 oz (55.566 kg)  BMI 22.4 kg/m2 Gen: Well NAD HEENT: EOMI,  MMM Lungs: CTABL Nl WOB Heart: RRR no MRG Abd: NABS, NT, ND Exts: Non edematous BL  LE, warm and well perfused.  Skin: L cheek lesion resolved. Minor 1mm scar w/ nomrla skin coloration  No results found  for this or any previous visit (from the past 72 hour(s)).

## 2013-02-08 NOTE — Assessment & Plan Note (Signed)
Established w/ yearly [place] kidney BMET today

## 2013-02-08 NOTE — Assessment & Plan Note (Signed)
Completely resolved.  Minimal scar - difficult to find

## 2013-02-08 NOTE — Patient Instructions (Addendum)
You are doing fantastic Please decrease your amlodipine to 5mg  daily Please continue taking your other medications as prescribed Please start taking Calcium citrate (800-1000mg  daily) after your run out of your current calcium/Vit D3 supplement (1/2 pill twice a day) Please start taking Vit D3 1000units once daily after you run out of your current calcium/Vit D supplement

## 2013-02-08 NOTE — Assessment & Plan Note (Signed)
Currently taking Ca carbonate 300mg  daily Taking Vit D3 1400mg    Pt to start taking Ca citrate adn 1000mg  Vit D3

## 2013-02-09 LAB — BASIC METABOLIC PANEL
CO2: 25 mEq/L (ref 19–32)
Calcium: 8.9 mg/dL (ref 8.4–10.5)
Glucose, Bld: 104 mg/dL — ABNORMAL HIGH (ref 70–99)
Potassium: 4.8 mEq/L (ref 3.5–5.3)
Sodium: 136 mEq/L (ref 135–145)

## 2013-02-13 ENCOUNTER — Telehealth: Payer: Self-pay | Admitting: *Deleted

## 2013-02-13 NOTE — Telephone Encounter (Signed)
DCd at last appt

## 2013-02-13 NOTE — Telephone Encounter (Signed)
Requesting refill for hydralazine but it is not on [facility] Wyatt Haste, RN-BSN

## 2013-06-07 ENCOUNTER — Encounter: Payer: Self-pay | Admitting: Family Medicine

## 2013-06-07 ENCOUNTER — Ambulatory Visit (INDEPENDENT_AMBULATORY_CARE_PROVIDER_SITE_OTHER): Payer: Medicare Other | Admitting: Family Medicine

## 2013-06-07 VITALS — BP 119/72 | HR 83 | Temp 98.3°F | Wt 117.0 lb

## 2013-06-07 DIAGNOSIS — Z23 Encounter for immunization: Secondary | ICD-10-CM

## 2013-06-07 DIAGNOSIS — R195 Other fecal abnormalities: Secondary | ICD-10-CM | POA: Insufficient documentation

## 2013-06-07 DIAGNOSIS — R42 Dizziness and giddiness: Secondary | ICD-10-CM

## 2013-06-07 DIAGNOSIS — I10 Essential (primary) hypertension: Secondary | ICD-10-CM

## 2013-06-07 DIAGNOSIS — Z7189 Other specified counseling: Secondary | ICD-10-CM

## 2013-06-07 MED ORDER — CARVEDILOL 12.5 MG PO TABS: 25.0000 mg | ORAL_TABLET | Freq: Two times a day (BID) | ORAL | Status: DC

## 2013-06-07 NOTE — Assessment & Plan Note (Addendum)
Likely secondary to low BP/orthostatics ( drop going from lying to standing) Decrease carvedilol. Recheck pressures in 2 wks and f/u in my office in 4 wks.

## 2013-06-07 NOTE — Assessment & Plan Note (Signed)
Does not sound infectious May have started as viral gastro w/ residual gut irritation.  Imodium for relief Precautions reviewed.

## 2013-06-07 NOTE — Patient Instructions (Signed)
Thank you for coming in today You are doing well overall Pelase decrease your carvedilol. Take half of your current dose. I have sent a new prescription over to the pharmacy for 12.5 mg pills Please come back in 2 weeks for a blood pressure check Please come back to see me in 4 weeks Please start immodium for the loose stools Please start ensure (or similar) drinks 2-3 times a day between meals for additional calories.

## 2013-06-07 NOTE — Assessment & Plan Note (Addendum)
Below JNC 8 recommendations and on double therapy.  + Orthostatics ( change going from lying to standing) Decrease Coreg to 12.5mg  (from 25)  Recheck pressure at nursing check in 2 wks and ff/u w/ me in 4 wks

## 2013-06-07 NOTE — Progress Notes (Signed)
Christine Armstrong is a 79 y.o. female who presents to [facility] today for dizziness  Dizziness: occurs when initially getting up. Goes away after waiting for a second. Denies confusion, LOC, falls. Taking norvasc and carvedilol for years. Lately carvedilol makes pt feel poorly.   Loose stools: going on for 2 months. 1-2 BM daily. Typically looser in the evening. No watery/bloody stools. Denies fevers, night sweats. 20lb wt loss over past year.    The following portions of the patient's history were reviewed and updated as appropriate: allergies, current medications, past medical history, family and social history, and problem list.  Patient is a nonsmoker.  Health Maintenance: flu shot, Zostavax ordered  Past Medical History  Diagnosis Date  . CKD (chronic kidney disease) stage 3, GFR 30-59 ml/min   . HTN (hypertension)   . Benign skin lesion   . Osteoporosis     ROS as above otherwise neg.    Medications reviewed. Current Outpatient Prescriptions  Medication Sig Dispense Refill  . amLODipine (NORVASC) 5 MG tablet Take 1 tablet (5 mg total) by mouth daily.  90 tablet  3  . aspirin 81 MG tablet Take 81 mg by mouth daily.        . carvedilol (COREG) 12.5 MG tablet Take 2 tablets (25 mg total) by mouth 2 (two) times daily with a meal.  180 tablet  3   No current facility-administered medications for this visit.    Exam: BP 138/73  Pulse 76  Temp(Src) 98.3 F (36.8 C) (Oral)  Wt 117 lb (53.071 kg) Gen: Well NAD HEENT: EOMI,  MMM Lungs: CTABL Nl WOB Heart: RRR II/VI systolic murmur w/ radiation ot the neck Abd: NABS, NT, ND Exts: Non edematous BL  LE, warm and well perfused.   No results found for this or any previous visit (from the past 72 hour(s)).  A/P (as seen in Problem list)  HYPERTENSION, BENIGN SYSTEMIC Below JNC 8 recommendations and on double therapy.  + Orthostatics ( change going from lying to standing) Decrease Coreg to 12.5mg  (from 25)  Recheck pressure at  nursing check in 2 wks and ff/u w/ me in 4 wks   Loose stools Does not sound infectious May have started as viral gastro w/ residual gut irritation.  Imodium for relief Precautions reviewed.  Dizziness Likely secondary to low BP/orthostatics ( drop going from lying to standing) Decrease carvedilol. Recheck pressures in 2 wks and f/u in my office in 4 wks.

## 2013-12-07 ENCOUNTER — Ambulatory Visit (INDEPENDENT_AMBULATORY_CARE_PROVIDER_SITE_OTHER): Payer: Medicare Other | Admitting: Family Medicine

## 2013-12-07 ENCOUNTER — Encounter: Payer: Self-pay | Admitting: Family Medicine

## 2013-12-07 VITALS — BP 136/68 | HR 86 | Temp 97.9°F | Wt 101.0 lb

## 2013-12-07 DIAGNOSIS — I10 Essential (primary) hypertension: Secondary | ICD-10-CM

## 2013-12-07 DIAGNOSIS — R195 Other fecal abnormalities: Secondary | ICD-10-CM

## 2013-12-07 DIAGNOSIS — N183 Chronic kidney disease, stage 3 unspecified: Secondary | ICD-10-CM

## 2013-12-07 MED ORDER — PANCRELIPASE (LIP-PROT-AMYL) 24000-76000 UNITS PO CPEP: ORAL_CAPSULE | ORAL | Status: DC

## 2013-12-07 NOTE — Assessment & Plan Note (Signed)
Continue f/u w/ renal CMET today

## 2013-12-07 NOTE — Patient Instructions (Signed)
The cause of your loose stools is unclear but is likely related to your pancreas not working very well. We will get blood work and a scan to better evaluate the cause.  Please start the Creon to help your bowels Please also restart the imodium. If this does not help we can try something stronger Please stop the carvedilol and come back for a nursing blood pressure check in 2 weeks.

## 2013-12-07 NOTE — Progress Notes (Signed)
Christine Armstrong is a 80 y.o. female who presents to [facility] today for loose stools  Loose stools ongoing for 1 year. Now down 42 lbs in 2 years. Occurs every time pt eats. Avoids greasy and spicy foods. Not formed. Denies bloody or pain w/ BM. Imodium w/o benefit. No ABD surgeries. Appetite preserved. Occasional epigastric pain w/ eating.   HTN: occasional lightheadedness and dizziness when going from sitting or lying to standing quickly. Denies CP, palpitations, sylncope.    The following portions of the patient's history were reviewed and updated as appropriate: allergies, current medications, past medical history, family and social history, and problem list.  Patient is a nonsmoker .  Health Maintenance: Tdap ordered  Past Medical History  Diagnosis Date  . CKD (chronic kidney disease) stage 3, GFR 30-59 ml/min   . HTN (hypertension)   . Benign skin lesion   . Osteoporosis     ROS as above otherwise neg.    Medications reviewed. Current Outpatient Prescriptions  Medication Sig Dispense Refill  . amLODipine (NORVASC) 5 MG tablet Take 1 tablet (5 mg total) by mouth daily.  90 tablet  3  . aspirin 81 MG tablet Take 81 mg by mouth daily.        . carvedilol (COREG) 12.5 MG tablet Take 2 tablets (25 mg total) by mouth 2 (two) times daily with a meal.  180 tablet  3  . Pancrelipase, Lip-Prot-Amyl, 24000 UNITS CPEP QAC  180 capsule  6   No current facility-administered medications for this visit.    Exam:  BP 136/68  Pulse 86  Temp(Src) 97.9 F (36.6 C) (Oral)  Wt 101 lb (45.813 kg) Gen: Well NAD HEENT: EOMI,  MMM Lungs: CTABL Nl WOB Heart: RRR no MRG Abd: NABS, NT, ND Exts: Non edematous BL  LE, warm and well perfused.   No results found for this or any previous visit (from the past 72 hour(s)).  A/P (as seen in Problem list)  HYPERTENSION, BENIGN SYSTEMIC Stop the carvedilol  Would prefer pt to run a little hypertensive to avoid orthostasis and falls.  Pt to return in 2  wks for nurse BP check   CKD (chronic kidney disease) stage 3, GFR 30-59 ml/min Continue f/u w/ renal CMET today  Loose stools Continues to have loose stools.  Etiology unclear. Favor pancreatic insufficiency as the most likely. May have bowel insufficiency.  Infection unlikely due to length of illness and abscence of other symptoms.  Possible malignancy (pt refused colonoscopy). Discussed w/ pt who would like to have CT scan. BUt pt aware that if lesion found the workkup and treatment could be challenging and pt would not want this.  Start Creon CMET today  lipase Pt to restart imodium. If no improvement consider Lomotil

## 2013-12-07 NOTE — Assessment & Plan Note (Signed)
Continues to have loose stools.  Etiology unclear. Favor pancreatic insufficiency as the most likely. May have bowel insufficiency.  Infection unlikely due to length of illness and abscence of other symptoms.  Possible malignancy (pt refused colonoscopy). Discussed w/ pt who would like to have CT scan. BUt pt aware that if lesion found the workkup and treatment could be challenging and pt would not want this.  Start Creon CMET today  lipase Pt to restart imodium. If no improvement consider Lomotil

## 2013-12-07 NOTE — Assessment & Plan Note (Signed)
Stop the carvedilol  Would prefer pt to run a little hypertensive to avoid orthostasis and falls.  Pt to return in 2 wks for nurse BP check

## 2013-12-08 LAB — COMPREHENSIVE METABOLIC PANEL
ALBUMIN: 3.1 g/dL — AB (ref 3.5–5.2)
ALK PHOS: 60 U/L (ref 39–117)
ALT: <8 U/L (ref 0–35)
AST: 12 U/L (ref 0–37)
BUN: 31 mg/dL — AB (ref 6–23)
CO2: 23 mEq/L (ref 19–32)
Calcium: 8.1 mg/dL — ABNORMAL LOW (ref 8.4–10.5)
Chloride: 110 mEq/L (ref 96–112)
Creat: 1.97 mg/dL — ABNORMAL HIGH (ref 0.50–1.10)
Glucose, Bld: 96 mg/dL (ref 70–99)
POTASSIUM: 4 meq/L (ref 3.5–5.3)
Sodium: 141 mEq/L (ref 135–145)
Total Bilirubin: 0.3 mg/dL (ref 0.2–1.2)
Total Protein: 6.1 g/dL (ref 6.0–8.3)

## 2013-12-08 LAB — LIPASE: Lipase: 23 U/L (ref 0–75)

## 2013-12-13 ENCOUNTER — Ambulatory Visit (HOSPITAL_COMMUNITY)
Admission: RE | Admit: 2013-12-13 | Discharge: 2013-12-13 | Disposition: A | Discharge status: Home / Self Care | Payer: Medicare Other | Source: Ambulatory Visit | Attending: Family Medicine | Admitting: Family Medicine

## 2013-12-13 DIAGNOSIS — R195 Other fecal abnormalities: Secondary | ICD-10-CM | POA: Diagnosis present

## 2013-12-14 ENCOUNTER — Telehealth: Payer: Self-pay | Admitting: *Deleted

## 2013-12-14 NOTE — Telephone Encounter (Signed)
Patient is aware of results of CT.  She asked me to call her daughter to schedule her lab appt with her.  LM for patient to call us back to schedule h.pylori.  Thanks [employer]

## 2013-12-14 NOTE — Telephone Encounter (Signed)
Message copied by Valerie Roys on Thu Dec 14, 2013 10:04 AM ------      Message from: [facility], DAVID J      Created: Thu Dec 14, 2013  8:52 AM       Limited study. Possible stomach inflammation      Would like for pt to come in for H.Pylori study      Call pt to see if amenable?       ----- Message -----         From: Rad Results In Interface         Sent: 12/13/2013   2:29 PM           To: Waldemar Dickens, MD                   ------

## 2013-12-15 ENCOUNTER — Other Ambulatory Visit (INDEPENDENT_AMBULATORY_CARE_PROVIDER_SITE_OTHER): Payer: Medicare Other

## 2013-12-15 DIAGNOSIS — A048 Other specified bacterial intestinal infections: Secondary | ICD-10-CM

## 2013-12-15 DIAGNOSIS — R109 Unspecified abdominal pain: Secondary | ICD-10-CM

## 2013-12-15 LAB — POCT H PYLORI SCREEN: H Pylori Screen, POC: POSITIVE

## 2013-12-15 MED ORDER — AMOXICILLIN 500 MG PO CAPS: 500.0000 mg | ORAL_CAPSULE | Freq: Two times a day (BID) | ORAL | Status: DC

## 2013-12-15 MED ORDER — CLARITHROMYCIN 250 MG PO TABS: 250.0000 mg | ORAL_TABLET | Freq: Two times a day (BID) | ORAL | Status: DC

## 2013-12-15 MED ORDER — OMEPRAZOLE 20 MG PO CPDR: 20.0000 mg | DELAYED_RELEASE_CAPSULE | Freq: Every day | ORAL | Status: DC

## 2013-12-15 NOTE — Progress Notes (Signed)
HPylori breath test + Start amox, clarithro, Prilosec (decreased doses based on GFR of 27%) Improving on Creon  Linna Darner, MD Family Medicine PGY-3 12/15/2013, 4:54 PM

## 2013-12-19 NOTE — Telephone Encounter (Signed)
appt made and pt came into clinic. Jazmin Hartsell,CMA

## 2014-01-15 ENCOUNTER — Telehealth: Payer: Self-pay | Admitting: Family Medicine

## 2014-01-15 NOTE — Telephone Encounter (Signed)
Daughter called to say that mother was complaining of burning sensation to stomach when she takes the Pancrelipase Lip-Prot-Amyl 24000 units med.  Please call back to advise her.

## 2014-01-16 NOTE — Telephone Encounter (Signed)
Daughter called back today to see why she has still not received a call back from the doctor or nurse. Please call. jw

## 2014-01-17 NOTE — Telephone Encounter (Signed)
Returned phone call to daughter. Pt still having symptoms of heart burn despite completing antibiotic therapy for h. Pylori infection. Also reporting small amount of blood in stool. Asked daughter to make sure she is still taking omeprazole, if still taking this can try to stop creon for a few days to see if it is the pills causing symptoms or if she is still having ulcer symptoms. Pt scheduled for f/u visit on Monday 8/5 3:45pm. -Dr. Lamar Benes

## 2014-01-22 ENCOUNTER — Encounter: Payer: Self-pay | Admitting: Family Medicine

## 2014-01-22 ENCOUNTER — Ambulatory Visit (INDEPENDENT_AMBULATORY_CARE_PROVIDER_SITE_OTHER): Payer: Medicare Other | Admitting: Family Medicine

## 2014-01-22 VITALS — BP 177/78 | HR 109 | Temp 98.1°F | Wt 100.0 lb

## 2014-01-22 DIAGNOSIS — R195 Other fecal abnormalities: Secondary | ICD-10-CM

## 2014-01-22 DIAGNOSIS — R634 Abnormal weight loss: Secondary | ICD-10-CM

## 2014-01-22 DIAGNOSIS — R197 Diarrhea, unspecified: Secondary | ICD-10-CM

## 2014-01-22 DIAGNOSIS — K529 Noninfective gastroenteritis and colitis, unspecified: Secondary | ICD-10-CM

## 2014-01-22 DIAGNOSIS — K921 Melena: Secondary | ICD-10-CM

## 2014-01-22 DIAGNOSIS — I10 Essential (primary) hypertension: Secondary | ICD-10-CM

## 2014-01-22 NOTE — Patient Instructions (Signed)
Blood pressure: Restart the Coreg (carvedilol) at 12.5mg  twice a day. You were taking 25mg  (2 tablets) twice a day before.  Loperamide: Take up to 8 of the 2mg  tablets a day.. Decrease this as you develop formed stools.  Someone will call you for the GI referral.

## 2014-01-22 NOTE — Progress Notes (Signed)
Patient ID: Christine Armstrong, female   DOB: May 20, 1924, 80 y.o.   MRN: [id number]   Subjective:    Patient ID: Christine Armstrong, female    DOB: 05-15-1924, 80 y.o.   MRN: [id number]  HPI  CC: burning when taking medication  # Diarrhea / hematochezia / weight loss:  Patient with chronic history of diarrhea despite multiple treatments. Started on creon/pancreatic enzyme replacement in June for suspected chronic pancreatic insufficiency  Also treated for h pylori infection at that visit, and had CT abdomen/pelvis which showed wall thickening of stomach concerning for gastritis but no bowel obstruction  Patient complains of burning and discomfort every time she took the creon tablets.  After stopping the creon 2 weeks ago she has not had any symptoms  She reports having some blood in her stool 2 weeks ago when she last took the creon, since that time has not had any  30lb weight loss over past 1 year.   Does not remember if she has ever had a colonoscopy, thinks it may have been in her 63s if at all. ROS: no dizziness/lightheadedness, night sweats, CP, SOB, difficulty swallowing, +epigastric burning but not recently, no dysuria  # Hypertension  Coreg stopped at last visit  BP elevated today ROS: denies ever having syncope, dizziness while on her BP meds  Review of Systems   See HPI for ROS. Objective:  BP 177/78  Pulse 109  Temp(Src) 98.1 F (36.7 C) (Oral)  Wt 100 lb (45.36 kg)  General: NAD Cardiac: RRR, normal heart sounds, no murmurs. 2+ radial and PT pulses bilaterally Respiratory: CTAB, normal effort Abdomen: soft, thin, nontender, nondistended, no hepatic or splenomegaly. Bowel sounds present Extremities: no edema or cyanosis. WWP. Skin: warm and dry, no rashes noted Neuro: alert and oriented, no focal deficits     Assessment & Plan:  See Problem List Documentation

## 2014-01-23 ENCOUNTER — Encounter: Payer: Self-pay | Admitting: Gastroenterology

## 2014-01-23 NOTE — Assessment & Plan Note (Signed)
Not at goal today. No evidence of hypertensive urgency Plan: Restart coreg at 1/2 previous dose (12.5mg  BID)

## 2014-01-23 NOTE — Assessment & Plan Note (Addendum)
Symptoms of diarrhea, hematochezia, weight loss is concerning for underlying malignancy. Discussed with patient again today and patient and daughter are agreeable to pursuing further workup with GI referral and possible colonoscopy. Plan: GI referral. Stop creon. Can try imodium, given instructions on increasing dose to titrate to symptoms.  Discussed case with Dr. Andria Frames

## 2014-01-29 ENCOUNTER — Encounter: Payer: Self-pay | Admitting: *Deleted

## 2014-02-01 ENCOUNTER — Other Ambulatory Visit: Payer: Self-pay | Admitting: *Deleted

## 2014-02-01 ENCOUNTER — Other Ambulatory Visit (INDEPENDENT_AMBULATORY_CARE_PROVIDER_SITE_OTHER): Payer: Medicare Other

## 2014-02-01 ENCOUNTER — Encounter: Payer: Self-pay | Admitting: Gastroenterology

## 2014-02-01 ENCOUNTER — Ambulatory Visit (INDEPENDENT_AMBULATORY_CARE_PROVIDER_SITE_OTHER): Payer: Medicare Other | Admitting: Gastroenterology

## 2014-02-01 VITALS — BP 120/60 | HR 80 | Ht 62.0 in | Wt 99.2 lb

## 2014-02-01 DIAGNOSIS — R159 Full incontinence of feces: Secondary | ICD-10-CM

## 2014-02-01 DIAGNOSIS — R634 Abnormal weight loss: Secondary | ICD-10-CM

## 2014-02-01 DIAGNOSIS — D649 Anemia, unspecified: Secondary | ICD-10-CM

## 2014-02-01 DIAGNOSIS — D6489 Other specified anemias: Secondary | ICD-10-CM

## 2014-02-01 DIAGNOSIS — R197 Diarrhea, unspecified: Secondary | ICD-10-CM | POA: Insufficient documentation

## 2014-02-01 LAB — CBC WITH DIFFERENTIAL/PLATELET
Basophils Absolute: 0 10*3/uL (ref 0.0–0.1)
Basophils Relative: 0 % (ref 0.0–3.0)
EOS ABS: 0 10*3/uL (ref 0.0–0.7)
Eosinophils Relative: 1 % (ref 0.0–5.0)
HCT: 21.8 % — CL (ref 36.0–46.0)
Hemoglobin: 6.7 g/dL — CL (ref 12.0–15.0)
Lymphocytes Relative: 21.8 % (ref 12.0–46.0)
Lymphs Abs: 0.8 10*3/uL (ref 0.7–4.0)
MCHC: 30.7 g/dL (ref 30.0–36.0)
MCV: 71.4 fl — AB (ref 78.0–100.0)
MONO ABS: 0.4 10*3/uL (ref 0.1–1.0)
Monocytes Relative: 12.3 % — ABNORMAL HIGH (ref 3.0–12.0)
NEUTROS PCT: 64.9 % (ref 43.0–77.0)
Neutro Abs: 2.3 10*3/uL (ref 1.4–7.7)
PLATELETS: 268 10*3/uL (ref 150.0–400.0)
RBC: 3.05 Mil/uL — ABNORMAL LOW (ref 3.87–5.11)
RDW: 17.9 % — AB (ref 11.5–15.5)
WBC: 3.6 10*3/uL — ABNORMAL LOW (ref 4.0–10.5)

## 2014-02-01 LAB — TSH: TSH: 0.75 u[IU]/mL (ref 0.35–4.50)

## 2014-02-01 LAB — IBC PANEL
Iron: 12 ug/dL — ABNORMAL LOW (ref 42–145)
Saturation Ratios: 3.7 % — ABNORMAL LOW (ref 20.0–50.0)
Transferrin: 230.6 mg/dL (ref 212.0–360.0)

## 2014-02-01 LAB — FERRITIN: Ferritin: 7.5 ng/mL — ABNORMAL LOW (ref 10.0–291.0)

## 2014-02-01 LAB — IGA: IgA: 120 mg/dL (ref 68–378)

## 2014-02-01 MED ORDER — MOVIPREP 100 G PO SOLR: 1.0000 | Freq: Once | ORAL | Status: DC

## 2014-02-01 NOTE — Patient Instructions (Addendum)
You have been scheduled for an endoscopy and colonoscopy with Dr. Hilarie Fredrickson. Please follow the written instructions given to you at your visit today. Please pick up your prep at the pharmacy within the next 1-3 days. If you use inhalers (even only as needed), please bring them with you on the day of your procedure. Your physician has requested that you go to www.startemmi.com and enter the access code given to you at your visit today. This web site gives a general overview about your procedure. However, you should still follow specific instructions given to you by our office regarding your preparation for the procedure.  Your physician has requested that you go to the basement for the following lab work before leaving today: CBC TSH TTG IGA  Your physician has requested that you go to the basement for the following stool studies before leaving today: C-diff Stool Culture  Ova and Parasite Pancreatic Elastase

## 2014-02-01 NOTE — Progress Notes (Addendum)
02/01/2014 [place] [id number] February 10, 1924   HISTORY OF PRESENT ILLNESS:  This is a pleasant 80 year old female who is new to our practice and presents here today with her daughter for evaluation of diarrhea and weight loss.  She has limited PMH with only HTN and CKD stage 3.  She says that she's had severe diarrhea for the past 1-2 years.  Has diarrhea every time that she eats something.  She has urgency with incontinence at times.  Sees bright red blood on the rare occasion that she has to strain to move her bowels.  She has lost 42 pounds within the past 2 years and 16 of them have been within the past 3-4 months.  CT scan of the abdomen and pelvis without contrast showed wall thickening of the proximal stomach concerning for gastritis, but was otherwise unremarkable.  Recent CMP and lipase were unremarkable except for CKD.  Apparently she was given some Creon to try recently, but says that it burned her stomach so she did not take it for long.   Past Medical History  Diagnosis Date  . CKD (chronic kidney disease) stage 3, GFR 30-59 ml/min   . HTN (hypertension)   . Benign skin lesion   . Osteoporosis   . H. pylori infection    Past Surgical History  Procedure Laterality Date  . Neg hx      reports that she has never smoked. She has never used smokeless tobacco. She reports that she does not drink alcohol or use illicit drugs. family history includes Cancer in her father; Hypertension in her brother; Liver cancer in her mother. There is no history of Colon cancer. Allergies  Allergen Reactions  . Ace Inhibitors     REACTION: angioedema      Outpatient Encounter Prescriptions as of 02/01/2014  Medication Sig  . amLODipine (NORVASC) 5 MG tablet Take 1 tablet (5 mg total) by mouth daily.  Marland Kitchen aspirin 81 MG tablet Take 81 mg by mouth daily.    . carvedilol (COREG) 12.5 MG tablet Take 12.5 mg by mouth 2 (two) times daily with a meal.  . omeprazole (PRILOSEC) 20 MG capsule Take  1 capsule (20 mg total) by mouth daily.  Marland Kitchen MOVIPREP 100 G SOLR Take 1 kit (200 g total) by mouth once.  . [DISCONTINUED] amoxicillin (AMOXIL) 500 MG capsule Take 1 capsule (500 mg total) by mouth 2 (two) times daily.  . [DISCONTINUED] clarithromycin (BIAXIN) 250 MG tablet Take 1 tablet (250 mg total) by mouth 2 (two) times daily.     REVIEW OF SYSTEMS  : All other systems reviewed and negative except where noted in the History of Present Illness.   PHYSICAL EXAM: BP 120/60  Pulse 80  Ht _0  (1.575 m)  Wt 99 lb 3.2 oz (44.997 kg)  BMI 18.14 kg/m2 General:  Thin elderly black female in no acute distress Head: Normocephalic and atraumatic Eyes:  Sclerae anicteric, conjunctiva pink. Ears: Normal auditory acuity Lungs: Clear throughout to auscultation Heart: Regular rate and rhythm Abdomen: Soft, non-distended.  Normal bowel sounds.  Non-tender. Rectal:  Deferred.  Will be done at the time of colonoscopy. Musculoskeletal: Symmetrical with no gross deformities  Skin: No lesions on visible extremities Extremities: No edema  Neurological: Alert oriented x 4, grossly non-focal Psychological:  Alert and cooperative. Normal mood and affect  ASSESSMENT AND PLAN: -80 year old female who diarrhea x 1-2 years with urgency and fecal incontinence at times as  well as weight loss of 42 pounds  *Will schedule EGD and colonoscopy for further evaluation.  Patient and her daughter are agreeable to proceed.  The risks, benefits, and alternatives were discussed with the patient and she consents to proceed. *Will check labs including CBC, TSH, and celiac labs. *Will check stool studies including Cdiff by PCR, O & P, and culture for enteric pathogens as well as elastase. *Can use Imodium prn for now.    Addendum: Reviewed and agree with initial management. Spoke to [employer], PA-C and hemoglobin returned low at 6.7, also iron deficient Agree with endoscopy and colonoscopy after discussion today  regarding risks and benefits Jerene Bears, MD

## 2014-02-04 LAB — T-TRANSGLUTAMINASE (TTG) IGG

## 2014-02-05 ENCOUNTER — Encounter (INDEPENDENT_AMBULATORY_CARE_PROVIDER_SITE_OTHER): Payer: Self-pay

## 2014-02-05 ENCOUNTER — Encounter (HOSPITAL_COMMUNITY)
Admission: RE | Admit: 2014-02-05 | Discharge: 2014-02-05 | Disposition: A | Discharge status: Home / Self Care | Payer: Medicare Other | Source: Ambulatory Visit | Attending: Internal Medicine | Admitting: Internal Medicine

## 2014-02-05 DIAGNOSIS — D6489 Other specified anemias: Secondary | ICD-10-CM | POA: Insufficient documentation

## 2014-02-05 LAB — PREPARE RBC (CROSSMATCH)

## 2014-02-05 LAB — ABO/RH: ABO/RH(D): O POS

## 2014-02-06 ENCOUNTER — Ambulatory Visit: Payer: Medicare Other

## 2014-02-06 ENCOUNTER — Encounter (HOSPITAL_COMMUNITY)
Admission: RE | Admit: 2014-02-06 | Discharge: 2014-02-06 | Disposition: A | Discharge status: Home / Self Care | Payer: Medicare Other | Source: Ambulatory Visit | Attending: Internal Medicine | Admitting: Internal Medicine

## 2014-02-06 ENCOUNTER — Encounter (HOSPITAL_COMMUNITY): Payer: Self-pay

## 2014-02-06 VITALS — BP 151/64 | HR 60 | Temp 97.4°F | Resp 20 | Ht 62.0 in | Wt 99.2 lb

## 2014-02-06 DIAGNOSIS — R634 Abnormal weight loss: Secondary | ICD-10-CM

## 2014-02-06 DIAGNOSIS — R197 Diarrhea, unspecified: Secondary | ICD-10-CM

## 2014-02-06 DIAGNOSIS — D6489 Other specified anemias: Secondary | ICD-10-CM

## 2014-02-06 DIAGNOSIS — R159 Full incontinence of feces: Secondary | ICD-10-CM

## 2014-02-06 HISTORY — DX: Anemia, unspecified: D64.9

## 2014-02-06 MED ORDER — SODIUM CHLORIDE 0.9 % IV SOLN
Freq: Once | INTRAVENOUS | Status: AC
  Administered 2014-02-06: 09:00:00 via INTRAVENOUS

## 2014-02-06 NOTE — Discharge Instructions (Signed)
Blood Transfusion  A blood transfusion replaces your blood or some of its parts. Blood is replaced when you have lost blood because of surgery, an accident, or for severe blood conditions like anemia. You can donate blood to be used on yourself if you have a planned surgery. If you lose blood during that surgery, your own blood can be given back to you. Any blood given to you is checked to make sure it matches your blood type. Your temperature, blood pressure, and heart rate (vital signs) will be checked often.  GET HELP RIGHT AWAY IF:   You feel sick to your stomach (nauseous) or throw up (vomit).  You have watery poop (diarrhea).  You have shortness of breath or trouble breathing.  You have blood in your pee (urine) or have dark colored pee.  You have chest pain or tightness.  Your eyes or skin turn yellow (jaundice).  You have a temperature by mouth above 102 F (38.9 C), not controlled by medicine.  You start to shake and have chills.  You develop a a red rash (hives) or feel itchy.  You develop lightheadedness or feel confused.  You develop back, joint, or muscle pain.  You do not feel hungry (lost appetite).  You feel tired, restless, or nervous.  You develop belly (abdominal) cramps. Document Released: 09/04/2008 Document Revised: 08/31/2011 Document Reviewed: 09/04/2008 ExitCare Patient Information 2015 [employer]. This information is not intended to replace advice given to you by your health care provider. Make sure you discuss any questions you have with your health care provider. Blood Transfusion Information WHAT IS A BLOOD TRANSFUSION? A transfusion is the replacement of blood or some of its parts. Blood is made up of multiple cells which provide different functions.  Red blood cells carry oxygen and are used for blood loss replacement.  White blood cells fight against infection.  Platelets control bleeding.  Plasma helps clot blood.  Other blood  products are available for specialized needs, such as hemophilia or other clotting disorders. BEFORE THE TRANSFUSION  Who gives blood for transfusions?   You may be able to donate blood to be used at a later date on yourself (autologous donation).  Relatives can be asked to donate blood. This is generally not any safer than if you have received blood from a stranger. The same precautions are taken to ensure safety when a relative's blood is donated.  Healthy volunteers who are fully evaluated to make sure their blood is safe. This is blood bank blood. Transfusion therapy is the safest it has ever been in the practice of medicine. Before blood is taken from a donor, a complete history is taken to make sure that person has no history of diseases nor engages in risky social behavior (examples are intravenous drug use or sexual activity with multiple partners). The donor's travel history is screened to minimize risk of transmitting infections, such as malaria. The donated blood is tested for signs of infectious diseases, such as HIV and hepatitis. The blood is then tested to be sure it is compatible with you in order to minimize the chance of a transfusion reaction. If you or a relative donates blood, this is often done in anticipation of surgery and is not appropriate for emergency situations. It takes many days to process the donated blood. RISKS AND COMPLICATIONS Although transfusion therapy is very safe and saves many lives, the main dangers of transfusion include:   Getting an infectious disease.  Developing a transfusion reaction. This   is an allergic reaction to something in the blood you were given. Every precaution is taken to prevent this. The decision to have a blood transfusion has been considered carefully by your caregiver before blood is given. Blood is not given unless the benefits outweigh the risks. AFTER THE TRANSFUSION  Right after receiving a blood transfusion, you will usually  feel much better and more energetic. This is especially true if your red blood cells have gotten low (anemic). The transfusion raises the level of the red blood cells which carry oxygen, and this usually causes an energy increase.  The nurse administering the transfusion will monitor you carefully for complications. HOME CARE INSTRUCTIONS  No special instructions are needed after a transfusion. You may find your energy is better. Speak with your caregiver about any limitations on activity for underlying diseases you may have. SEEK MEDICAL CARE IF:   Your condition is not improving after your transfusion.  You develop redness or irritation at the intravenous (IV) site. SEEK IMMEDIATE MEDICAL CARE IF:  Any of the following symptoms occur over the next 12 hours:  Shaking chills.  You have a temperature by mouth above 102 F (38.9 C), not controlled by medicine.  Chest, back, or muscle pain.  People around you feel you are not acting correctly or are confused.  Shortness of breath or difficulty breathing.  Dizziness and fainting.  You get a rash or develop hives.  You have a decrease in urine output.  Your urine turns a dark color or changes to pink, red, or brown. Any of the following symptoms occur over the next 10 days:  You have a temperature by mouth above 102 F (38.9 C), not controlled by medicine.  Shortness of breath.  Weakness after normal activity.  The white part of the eye turns yellow (jaundice).  You have a decrease in the amount of urine or are urinating less often.  Your urine turns a dark color or changes to pink, red, or brown. Document Released: 06/05/2000 Document Revised: 08/31/2011 Document Reviewed: 01/23/2008 ExitCare Patient Information 2015 [employer]. This information is not intended to replace advice given to you by your health care provider. Make sure you discuss any questions you have with your health care provider.  

## 2014-02-07 LAB — TYPE AND SCREEN
ABO/RH(D): O POS
ANTIBODY SCREEN: NEGATIVE
Unit division: 0
Unit division: 0

## 2014-02-07 LAB — OVA AND PARASITE EXAMINATION: OP: NONE SEEN

## 2014-02-08 LAB — CLOSTRIDIUM DIFFICILE BY PCR: Toxigenic C. Difficile by PCR: NOT DETECTED

## 2014-02-10 LAB — STOOL CULTURE

## 2014-02-12 LAB — PANCREATIC ELASTASE, FECAL: Pancreatic Elastase-1, Stool: 38 mcg/g — ABNORMAL LOW

## 2014-02-14 ENCOUNTER — Ambulatory Visit (AMBULATORY_SURGERY_CENTER): Payer: Medicare Other | Admitting: Internal Medicine

## 2014-02-14 ENCOUNTER — Other Ambulatory Visit: Payer: Self-pay

## 2014-02-14 ENCOUNTER — Encounter: Payer: Self-pay | Admitting: Internal Medicine

## 2014-02-14 VITALS — BP 155/58 | HR 55 | Temp 96.9°F | Resp 19 | Ht 62.0 in | Wt 99.0 lb

## 2014-02-14 DIAGNOSIS — D126 Benign neoplasm of colon, unspecified: Secondary | ICD-10-CM | POA: Diagnosis not present

## 2014-02-14 DIAGNOSIS — R634 Abnormal weight loss: Secondary | ICD-10-CM | POA: Diagnosis not present

## 2014-02-14 DIAGNOSIS — R197 Diarrhea, unspecified: Secondary | ICD-10-CM | POA: Diagnosis not present

## 2014-02-14 DIAGNOSIS — C169 Malignant neoplasm of stomach, unspecified: Secondary | ICD-10-CM

## 2014-02-14 DIAGNOSIS — D509 Iron deficiency anemia, unspecified: Secondary | ICD-10-CM | POA: Diagnosis not present

## 2014-02-14 DIAGNOSIS — A048 Other specified bacterial intestinal infections: Secondary | ICD-10-CM

## 2014-02-14 DIAGNOSIS — R109 Unspecified abdominal pain: Secondary | ICD-10-CM

## 2014-02-14 MED ORDER — INTEGRA PLUS PO CAPS: ORAL_CAPSULE | ORAL | Status: DC

## 2014-02-14 MED ORDER — OMEPRAZOLE 40 MG PO CPDR: 40.0000 mg | DELAYED_RELEASE_CAPSULE | Freq: Every day | ORAL | Status: AC

## 2014-02-14 NOTE — Progress Notes (Signed)
Stable to RR 

## 2014-02-14 NOTE — Patient Instructions (Addendum)
YOU HAD AN ENDOSCOPIC PROCEDURE TODAY AT THE [facility]: Refer to the procedure report that was given to you for any specific questions about what was found during the examination.  If the procedure report does not answer your questions, please call your gastroenterologist to clarify.  If you requested that your care partner not be given the details of your procedure findings, then the procedure report has been included in a sealed envelope for you to review at your convenience later.  YOU SHOULD EXPECT: Some feelings of bloating in the abdomen. Passage of more gas than usual.  Walking can help get rid of the air that was put into your GI tract during the procedure and reduce the bloating. If you had a lower endoscopy (such as a colonoscopy or flexible sigmoidoscopy) you may notice spotting of blood in your stool or on the toilet paper. If you underwent a bowel prep for your procedure, then you may not have a normal bowel movement for a few days.  DIET: Your first meal following the procedure should be a light meal and then it is ok to progress to your normal diet.  A half-sandwich or bowl of soup is an example of a good first meal.  Heavy or fried foods are harder to digest and may make you feel nauseous or bloated.  Likewise meals heavy in dairy and vegetables can cause extra gas to form and this can also increase the bloating.  Drink plenty of fluids but you should avoid alcoholic beverages for 24 hours.  ACTIVITY: Your care partner should take you home directly after the procedure.  You should plan to take it easy, moving slowly for the rest of the day.  You can resume normal activity the day after the procedure however you should NOT DRIVE or use heavy machinery for 24 hours (because of the sedation medicines used during the test).    SYMPTOMS TO REPORT IMMEDIATELY: A gastroenterologist can be reached at any hour.  During normal business hours, 8:30 AM to 5:00 PM Monday through Friday,  call [phone].  After hours and on weekends, please call the GI answering service at [phone] who will take a message and have the physician on call contact you.   Following lower endoscopy (colonoscopy or flexible sigmoidoscopy):  Excessive amounts of blood in the stool  Significant tenderness or worsening of abdominal pains  Swelling of the abdomen that is new, acute  Fever of 100F or higher  Following upper endoscopy (EGD)  Vomiting of blood or coffee ground material  New chest pain or pain under the shoulder blades  Painful or persistently difficult swallowing  New shortness of breath  Fever of 100F or higher  Black, tarry-looking stools  FOLLOW UP: If any biopsies were taken you will be contacted by phone or by letter within the next 1-3 weeks.  Call your gastroenterologist if you have not heard about the biopsies in 3 weeks.  Our staff will call the home number listed on your records the next business day following your procedure to check on you and address any questions or concerns that you may have at that time regarding the information given to you following your procedure. This is a courtesy call and so if there is no answer at the home number and we have not heard from you through the emergency physician on call, we will assume that you have returned to your regular daily activities without incident.  SIGNATURES/CONFIDENTIALITY: You and/or your care   partner have signed paperwork which will be entered into your electronic medical record.  These signatures attest to the fact that that the information above on your After Visit Summary has been reviewed and is understood.  Full responsibility of the confidentiality of this discharge information lies with you and/or your care-partner.  Await pathology results.   Oncology referral.  Dr Vena Rua nurse will call you.   Daily Iron replacement

## 2014-02-14 NOTE — Progress Notes (Signed)
Called to room to assist during endoscopic procedure.  Patient ID and intended procedure confirmed with present staff. Received instructions for my participation in the procedure from the performing physician.  

## 2014-02-14 NOTE — Op Note (Signed)
[place]  [address] [place], [address]   ENDOSCOPY PROCEDURE REPORT  PATIENT: Christine Armstrong, Christine Armstrong  MR#: [id number] BIRTHDATE: [birth year] , 89  yrs. old GENDER: Female ENDOSCOPIST: Jerene Bears, MD REFERRED BY:   Tawanna Sat, MD PROCEDURE DATE:  02/14/2014 PROCEDURE:  EGD w/ biopsy ASA CLASS:     Class III INDICATIONS:  weight loss, iron deficiency anemia, diarrhea. MEDICATIONS: MAC sedation, administered by CRNA and propofol (Diprivan) 50mg  IV TOPICAL ANESTHETIC: none  DESCRIPTION OF PROCEDURE: After the risks benefits and alternatives of the procedure were thoroughly explained, informed consent was obtained.  The LB [id number] endoscope was introduced through the mouth and advanced to the second portion of the duodenum. Without limitations.  The instrument was slowly withdrawn as the mucosa was fully examined.     ESOPHAGUS: The mucosa of the esophagus appeared normal.  Z line variable at 40 cm  STOMACH: A one-third circumferential fungating and ulcerated mass, measuring approximately 5 X 7 cm in size, was found in the gastric body and gastric fundus.  Multiple biopsies were performed using cold forceps.  DUODENUM: The duodenal mucosa showed no abnormalities in the bulb and second portion of the duodenum.  Retroflexed views revealed as previously described.     The scope was then withdrawn from the patient and the procedure completed.  COMPLICATIONS: There were no complications.  ENDOSCOPIC IMPRESSION: 1.   The mucosa of the esophagus appeared normal 2.   One-third circumferential mass most consistent with gastric adenocarcinoma, measuring approximately 5 X 7 cm in size, was found in the gastric body and gastric fundus; multiple biopsies 3.   The duodenal mucosa showed no abnormalities in the bulb and second portion of the duodenum  RECOMMENDATIONS: 1.  Await biopsy results 2.  Daily PPI, iron replacement 3.  Oncology  referral  eSigned:  Jerene Bears, MD 02/14/2014 12:06 PM   CC:The Patient and [employer] Tawanna Sat, MD  PATIENT NAME:  Christine Armstrong, Christine Armstrong MR#: [id number]

## 2014-02-14 NOTE — Op Note (Signed)
[place]  [address] [name], [address]   COLONOSCOPY PROCEDURE REPORT  PATIENT: Christine Armstrong, Christine Armstrong  MR#: [id number] BIRTHDATE: 07-04-1923 , 89  yrs. old GENDER: Female ENDOSCOPIST: Jerene Bears, MD REFERRED BY: Tawanna Sat, MD PROCEDURE DATE:  02/14/2014 PROCEDURE:   Colonoscopy with biopsy and Colonoscopy with snare polypectomy First Screening Colonoscopy - Avg.  risk and is 50 yrs.  old or older - No.  Prior Negative Screening - Now for repeat screening. N/A  History of Adenoma - Now for follow-up colonoscopy & has been > or = to 3 yrs.  N/A  Polyps Removed Today? Yes. ASA CLASS:   Class III INDICATIONS:diarrhea, iron deficiency anemia, weight loss. MEDICATIONS: MAC sedation, administered by CRNA and propofol (Diprivan) 110mg  IV  DESCRIPTION OF PROCEDURE:   After the risks benefits and alternatives of the procedure were thoroughly explained, informed consent was obtained.  A digital rectal exam revealed no rectal mass.   The LB [id number]  endoscope was introduced through the anus and advanced to the cecum, which was identified by both the appendix and ileocecal valve. No adverse events experienced. The quality of the prep was good, using MoviPrep  The instrument was then slowly withdrawn as the colon was fully examined.      COLON FINDINGS: Six sessile polyps ranging between 5-10 mm in size were found at the cecum, in the ascending colon, descending colon, and sigmoid colon.  Polypectomy was performed using cold snare (1) and using hot snare (5).  All resections were complete and all polyp tissue was completely retrieved.   A very large pedunculated polyp measuring 5-6 cm in size was found in the distal sigmoid colon.  Multiple biopsies of the lesion were performed using cold forceps.   Moderate diverticulosis was noted in the descending colon and sigmoid colon.  Retroflexion was not performed due to a narrow rectal vault. The time to  cecum=3 minutes 21 seconds. Withdrawal time=18 minutes 01 seconds.  The scope was withdrawn and the procedure completed.  COMPLICATIONS: There were no complications.  ENDOSCOPIC IMPRESSION: 1.   Six sessile polyps ranging between 5-10 mm in size were found at the cecum, in the ascending colon, descending colon, and sigmoid colon; Polypectomy was performed using cold snare and using hot snare 2.   Pedunculated polyp measuring 5-6 cm in size was found in the distal sigmoid colon; multiple biopsies of the lesion were performed 3.   Moderate diverticulosis was noted in the descending colon and sigmoid colon  RECOMMENDATIONS: 1.  Await pathology results 2.  Iron replacement 3.  Attempt at sigmoid polypectomy can be considered based on clinical course   eSigned:  Jerene Bears, MD 02/14/2014 12:25 PM   cc: The Patient; Tawanna Sat, MD   PATIENT NAME:  [name], [name] MR#: [id number]

## 2014-02-15 ENCOUNTER — Telehealth: Payer: Self-pay | Admitting: *Deleted

## 2014-02-15 NOTE — Telephone Encounter (Signed)
  Follow up Call-  Call back number 02/14/2014  Post procedure Call Back phone  # [phone]  Permission to leave phone message Yes     Patient questions:  Do you have a fever, pain , or abdominal swelling? No. Pain Score  0 *  Have you tolerated food without any problems? Yes.    Have you been able to return to your normal activities? Yes.    Do you have any questions about your discharge instructions: Diet   No. Medications  No. Follow up visit  No.  Do you have questions or concerns about your Care? No.  Actions: * If pain score is 4 or above: No action needed, pain <4.

## 2014-02-19 ENCOUNTER — Telehealth: Payer: Self-pay | Admitting: *Deleted

## 2014-02-19 NOTE — Telephone Encounter (Signed)
Spoke with patient's daughter and confirmed appointment with Dr. Benay Spice for 02/21/14.  Contact names, numbers, and directions were provided.

## 2014-02-19 NOTE — Telephone Encounter (Signed)
Unable to reach patient after trying several times.  Left voice message with name and contact number requesting a call back for appointment.

## 2014-02-21 ENCOUNTER — Ambulatory Visit: Payer: PRIVATE HEALTH INSURANCE

## 2014-02-21 ENCOUNTER — Encounter: Payer: Self-pay | Admitting: Oncology

## 2014-02-21 ENCOUNTER — Telehealth: Payer: Self-pay | Admitting: Oncology

## 2014-02-21 ENCOUNTER — Ambulatory Visit (HOSPITAL_BASED_OUTPATIENT_CLINIC_OR_DEPARTMENT_OTHER): Payer: PRIVATE HEALTH INSURANCE | Admitting: Oncology

## 2014-02-21 ENCOUNTER — Ambulatory Visit (HOSPITAL_BASED_OUTPATIENT_CLINIC_OR_DEPARTMENT_OTHER): Payer: PRIVATE HEALTH INSURANCE

## 2014-02-21 VITALS — BP 137/71 | HR 82 | Temp 98.5°F | Resp 17 | Ht 62.0 in | Wt 102.0 lb

## 2014-02-21 DIAGNOSIS — C169 Malignant neoplasm of stomach, unspecified: Secondary | ICD-10-CM

## 2014-02-21 DIAGNOSIS — R197 Diarrhea, unspecified: Secondary | ICD-10-CM

## 2014-02-21 DIAGNOSIS — D509 Iron deficiency anemia, unspecified: Secondary | ICD-10-CM

## 2014-02-21 DIAGNOSIS — D126 Benign neoplasm of colon, unspecified: Secondary | ICD-10-CM

## 2014-02-21 DIAGNOSIS — R221 Localized swelling, mass and lump, neck: Secondary | ICD-10-CM

## 2014-02-21 DIAGNOSIS — R220 Localized swelling, mass and lump, head: Secondary | ICD-10-CM

## 2014-02-21 LAB — COMPREHENSIVE METABOLIC PANEL (CC13)
ALT: 10 U/L (ref 0–55)
ANION GAP: 10 meq/L (ref 3–11)
AST: 17 U/L (ref 5–34)
Albumin: 2.6 g/dL — ABNORMAL LOW (ref 3.5–5.0)
Alkaline Phosphatase: 72 U/L (ref 40–150)
BUN: 27.8 mg/dL — AB (ref 7.0–26.0)
CALCIUM: 8.6 mg/dL (ref 8.4–10.4)
CO2: 22 meq/L (ref 22–29)
Chloride: 108 mEq/L (ref 98–109)
Creatinine: 1.7 mg/dL — ABNORMAL HIGH (ref 0.6–1.1)
GLUCOSE: 93 mg/dL (ref 70–140)
Potassium: 4.4 mEq/L (ref 3.5–5.1)
Sodium: 140 mEq/L (ref 136–145)
Total Bilirubin: <0.2 mg/dL (ref 0.20–1.20)
Total Protein: 6.6 g/dL (ref 6.4–8.3)

## 2014-02-21 LAB — CBC WITH DIFFERENTIAL/PLATELET
BASO%: 0.5 % (ref 0.0–2.0)
BASOS ABS: 0 10*3/uL (ref 0.0–0.1)
EOS ABS: 0 10*3/uL (ref 0.0–0.5)
EOS%: 0.9 % (ref 0.0–7.0)
HCT: 32 % — ABNORMAL LOW (ref 34.8–46.6)
HEMOGLOBIN: 9.6 g/dL — AB (ref 11.6–15.9)
LYMPH%: 21.9 % (ref 14.0–49.7)
MCH: 22.9 pg — ABNORMAL LOW (ref 25.1–34.0)
MCHC: 30.1 g/dL — ABNORMAL LOW (ref 31.5–36.0)
MCV: 76 fL — AB (ref 79.5–101.0)
MONO#: 0.6 10*3/uL (ref 0.1–0.9)
MONO%: 12.1 % (ref 0.0–14.0)
NEUT%: 64.6 % (ref 38.4–76.8)
NEUTROS ABS: 3.1 10*3/uL (ref 1.5–6.5)
Platelets: 264 10*3/uL (ref 145–400)
RBC: 4.21 10*6/uL (ref 3.70–5.45)
RDW: 19.6 % — AB (ref 11.2–14.5)
WBC: 4.8 10*3/uL (ref 3.9–10.3)
lymph#: 1.1 10*3/uL (ref 0.9–3.3)

## 2014-02-21 LAB — CEA: CEA: 1.1 ng/mL (ref 0.0–5.0)

## 2014-02-21 NOTE — Progress Notes (Signed)
[facility] Patient Consult   Referring MD:Jay Pyrtle   Christine Armstrong 80 y.o.  1924-03-04    Reason for Referral: Gastric cancer   HPI: Christine Armstrong reports diarrhea for several months and associated weight loss. She was evaluated in family medicine. The diarrhea did not improve with pancreatic enzyme replacement or treatment for H. Pylori. A CT of the abdomen and pelvis 12/13/2013 revealed no focal abnormality in the liver. There was wall thickening of the proximal stomach. No adenopathy.  She was referred to Dr. Hilarie Fredrickson. A stool C. difficile was negative. A stool culture revealed no Escherichia coli or Salmonella. No open or parasites were detected.  She was taken to a colonoscopy and upper endoscopy 02/14/2014. 6 sessile polyps were found in the cecum, a sending colon, descending colon, and sigmoid colon. The polyps were removed. A large polyp in the distal sigmoid colon was biopsied. The esophagus appeared normal. A one thirds circumferential fungating mass was found in the gastric body and fundus. Multiple biopsies were obtained. The duodenal mucosa showed no abnormality.  The pathology [phone]) confirmed invasive adenocarcinoma in the stomach biopsy. An H. pylori stain was negative. The polypectomy samples revealed tubular adenomas. The biopsy from the sigmoid colon polyp revealed fragments of a tubular adenoma with no high-grade dysplasia or malignancy.  She was noted to have iron deficiency anemia with a hemoglobin of 6.7 on 02/01/2014. She was placed on iron and received 2 units of packed red blood cells 02/06/2014. She reports feeling better after the red cell transfusion.  Diarrhea has improved.  Past Medical History  Diagnosis Date  . CKD (chronic kidney disease) stage 3, GFR 30-59 ml/min   . HTN (hypertension)   . Benign skin lesion   . Osteoporosis   . H. pylori infection   . Anemia-iron deficiency   August 2015   .  multiple colon polyps    August 2015     .  G 18 P. 14  Past Surgical History  Procedure Laterality Date  . Neg hx      Medications: Reviewed  Allergies:  Allergies  Allergen Reactions  . Ace Inhibitors     REACTION: angioedema    Family history: Her mother and father both had "cancer ". She cannot be more specific. No other family history of cancer.  Social History:   She lives with her daughter in [place]. She worked as a Psychologist, sport and exercise. She does not use tobacco or alcohol. She previously chewed tobacco. No risk factor for HIV or hepatitis. She received a red cell transfusion in August 2015     ROS:   Positives include: 40 pound weight loss, diarrhea for months  A complete ROS was otherwise negative.  Physical Exam:  Blood pressure 137/71, pulse 82, temperature 98.5 F (36.9 C), temperature source Oral, resp. rate 17, height $RemoveBe'5\' 2"'qsaHmcUNs$  (1.575 m), weight 120 lb (54.432 kg), SpO2 100.00%.  HEENT: Edentulous, oropharynx without visible mass, there is a soft 1.5 cm mobile mass at the thyroid isthmus, 3-4 cm mobile mass deep to the left jugular musculature Lungs: Clear bilaterally Cardiac: Regular rate and rhythm Abdomen: No hepatosplenomegaly, no mass, no apparent ascites. Tender in the left subcostal region.  Vascular: Trace ankle edema bilaterally Lymph nodes: No supraclavicular nodes. "Shotty "bilateral axillary and inguinal nodes. Neurologic: Alert and oriented, the motor exam appears intact in the upper and lower extremities Skin: No rash Musculoskeletal: No spine tenderness   LAB:  CBC  Lab Results  Component  Value Date   WBC 4.8 02/21/2014   HGB 9.6* 02/21/2014   HCT 32.0* 02/21/2014   MCV 76.0* 02/21/2014   PLT 264 02/21/2014   NEUTROABS 3.1 02/21/2014     CMP      Component Value Date/Time   NA 140 02/21/2014 1536   NA 141 12/07/2013 1655   K 4.4 02/21/2014 1536   K 4.0 12/07/2013 1655   CL 110 12/07/2013 1655   CO2 22 02/21/2014 1536   CO2 23 12/07/2013 1655   GLUCOSE 93 02/21/2014 1536    GLUCOSE 96 12/07/2013 1655   BUN 27.8* 02/21/2014 1536   BUN 31* 12/07/2013 1655   CREATININE 1.7* 02/21/2014 1536   CREATININE 1.97* 12/07/2013 1655   CREATININE 1.46* 08/23/2010 0956   CALCIUM 8.6 02/21/2014 1536   CALCIUM 8.1* 12/07/2013 1655   PROT 6.6 02/21/2014 1536   PROT 6.1 12/07/2013 1655   ALBUMIN 2.6* 02/21/2014 1536   ALBUMIN 3.1* 12/07/2013 1655   AST 17 02/21/2014 1536   AST 12 12/07/2013 1655   ALT 10 02/21/2014 1536   ALT <8 12/07/2013 1655   ALKPHOS 72 02/21/2014 1536   ALKPHOS 60 12/07/2013 1655   BILITOT <0.20 02/21/2014 1536   BILITOT 0.3 12/07/2013 1655   GFRNONAA 34* 08/23/2010 0956   GFRAA  Value: 41        The eGFR has been calculated using the MDRD equation. This calculation has not been validated in all clinical situations. eGFR's persistently <60 mL/min signify possible Chronic Kidney Disease.* 08/23/2010 0211    Imaging:  I reviewed the 12/13/2013 abdomen/pelvis CT   Assessment/Plan:   1. Gastric cancer  2. Iron deficiency anemia secondary to #1  3.   multiple colon polyps (tubular adenomas) on a colonoscopy 02/14/2014 including a large sigmoid colon polyp that was not removed  4.   diarrhea-etiology unclear  5.   neck masses-? Benign   Disposition:   Christine Armstrong has been diagnosed with gastric cancer. She presented with severe iron deficiency anemia. She feels better after a red cell transfusion. She is taking iron.  There is no CT or clinical evidence of metastatic disease unless the left neck mass is related to gastric cancer.  I discussed treatment options with Christine Armstrong and her daughter. She does not wish to consider surgery. We discussed radiation with concurrent capecitabine versus supportive care. She would like to think about this further.  Christine Armstrong agrees to a staging PET scan and a followup office visit next week. Her case will be presented at the GI tumor conference 02/28/2014.  The etiology of her diarrhea is unclear. I cannot relate this to the gastric  adenocarcinoma. She has no other symptoms to suggest carcinoid disease.  Harman, Walker 02/21/2014, 6:24 PM

## 2014-02-21 NOTE — Telephone Encounter (Signed)
gv adn printed appt sched and avs for pt for sept...sent pt to lab

## 2014-02-21 NOTE — Progress Notes (Signed)
Checked in new pt with no financial concerns at this time.  Pt has 2 insurances so she's in good shape but I gave her Raquel's card for any questions or concerns that may arise.

## 2014-02-28 ENCOUNTER — Encounter (HOSPITAL_COMMUNITY): Payer: Self-pay

## 2014-02-28 ENCOUNTER — Ambulatory Visit (HOSPITAL_COMMUNITY)
Admission: RE | Admit: 2014-02-28 | Discharge: 2014-02-28 | Disposition: A | Discharge status: Home / Self Care | Payer: PRIVATE HEALTH INSURANCE | Source: Ambulatory Visit | Attending: Oncology | Admitting: Oncology

## 2014-02-28 DIAGNOSIS — C772 Secondary and unspecified malignant neoplasm of intra-abdominal lymph nodes: Secondary | ICD-10-CM | POA: Diagnosis not present

## 2014-02-28 DIAGNOSIS — C169 Malignant neoplasm of stomach, unspecified: Secondary | ICD-10-CM | POA: Diagnosis present

## 2014-02-28 DIAGNOSIS — E049 Nontoxic goiter, unspecified: Secondary | ICD-10-CM | POA: Insufficient documentation

## 2014-02-28 LAB — GLUCOSE, CAPILLARY: GLUCOSE-CAPILLARY: 76 mg/dL (ref 70–99)

## 2014-02-28 MED ORDER — FLUDEOXYGLUCOSE F - 18 (FDG) INJECTION: 5.0000 | Freq: Once | INTRAVENOUS | Status: AC | PRN

## 2014-03-01 ENCOUNTER — Ambulatory Visit (HOSPITAL_BASED_OUTPATIENT_CLINIC_OR_DEPARTMENT_OTHER): Payer: PRIVATE HEALTH INSURANCE | Admitting: Nurse Practitioner

## 2014-03-01 VITALS — BP 129/70 | HR 70 | Temp 98.0°F | Resp 18 | Ht 62.0 in | Wt 99.5 lb

## 2014-03-01 DIAGNOSIS — R220 Localized swelling, mass and lump, head: Secondary | ICD-10-CM

## 2014-03-01 DIAGNOSIS — D509 Iron deficiency anemia, unspecified: Secondary | ICD-10-CM

## 2014-03-01 DIAGNOSIS — C169 Malignant neoplasm of stomach, unspecified: Secondary | ICD-10-CM

## 2014-03-01 DIAGNOSIS — R221 Localized swelling, mass and lump, neck: Secondary | ICD-10-CM

## 2014-03-01 DIAGNOSIS — R197 Diarrhea, unspecified: Secondary | ICD-10-CM

## 2014-03-01 NOTE — Progress Notes (Addendum)
[facility] OFFICE PROGRESS NOTE   Diagnosis:  Gastric cancer.  INTERVAL HISTORY:   Christine Armstrong returns as scheduled. She denies pain. She has a good appetite. She denies bleeding. She continues to have loose stools. She estimates 2-3 bowel movements a day. Stools are dark. She attributes this to oral iron.  Objective:  Vital signs in last 24 hours:  Blood pressure 129/70, pulse 70, temperature 98 F (36.7 C), temperature source Oral, resp. rate 18, height 5\' 2"  (1.575 m), weight 99 lb 8 oz (45.133 kg), SpO2 100.00%.    HEENT: No thrush or ulcers. 4 cm left neck mass. Resp: Lungs clear bilaterally. Cardio: Regular rate and rhythm. GI: Abdomen soft. Mild tenderness at the left upper abdomen. No organomegaly. No mass. Vascular: No leg edema. Neuro: Alert and oriented.  Skin: No rash.    Lab Results:  Lab Results  Component Value Date   WBC 4.8 02/21/2014   HGB 9.6* 02/21/2014   HCT 32.0* 02/21/2014   MCV 76.0* 02/21/2014   PLT 264 02/21/2014   NEUTROABS 3.1 02/21/2014    Imaging:  Nm Pet Image Initial (pi) Skull Base To Thigh  02/28/2014   CLINICAL DATA:  Initial treatment strategy for gastric carcinoma.  EXAM: NUCLEAR MEDICINE PET SKULL BASE TO THIGH  TECHNIQUE: 5.0 mCi F-18 FDG was injected intravenously. Full-ring PET imaging was performed from the skull base to thigh after the radiotracer. CT data was obtained and used for attenuation correction and anatomic localization.  FASTING BLOOD GLUCOSE:  Value: 76 mg/dl  COMPARISON:  CT 12/13/2013  FINDINGS: NECK  There is low metabolic activity associated with the large neck mass. This large neck mass appears to be centered in the thyroid gland and likely represents thyroid goiter. The mass measure approximately 5 cm on the left and right.  CHEST  There is a hypermetabolic right hilar lymph node with SUV max 5.4. This node is difficult to define on the non contrast CT. No suspicious pulmonary nodules.  ABDOMEN/PELVIS  There is  circumferential hypermetabolic activity associated with the proximal stomach (cardiac and fundal region). The metabolic activity is intense with SUV max equal 17. There are 3 hypermetabolic gastrohepatic ligament lymph nodes which are difficult to measure on the non contrast exam. One node is present on image 110 series 4 measuring 8 mm short axis. These nodes are moderately hypermetabolic with SUV max cecal 3.7.  There is no abnormal hypermetabolic activity within the liver. No retroperitoneal hypermetabolic nodes. No hypermetabolic pelvic nodes.  Focus of uptake within distal rectum is felt to represent activity associated with stool.  SKELETON  No focal hypermetabolic activity to suggest skeletal metastasis.  IMPRESSION: 1. Hypermetabolic mass in the gastric cardiac region consistent with primary gastric carcinoma. 2. Hypermetabolic nodal metastasis to 3 gastrohepatic ligament lymph nodes. 3. Suspicion for hypermetabolic metastasis to aright hilar lymph node. 4. Enlargement of the thyroid gland with low metabolic activity is felt to represent a benign goiter.   Electronically Signed   By: Suzy Bouchard M.D.   On: 02/28/2014 13:46    Medications: I have reviewed the patient's current medications.  Assessment/Plan: 1. Gastric cancer  PET scan 02/28/2014 with a hypermetabolic mass in the gastric cardia region, hypermetabolic nodal metastasis to 3 gastrohepatic ligament lymph nodes, suspicion for hypermetabolic metastasis to a right hilar lymph node, enlargement of the thyroid gland with low metabolic activity felt to represent a benign goiter. 2. Iron deficiency anemia secondary to #1. 3. Multiple colon polyps (tubular  adenomas) on a colonoscopy 02/14/2014 including a large sigmoid colon polyp that was not removed  4. Diarrhea-etiology unclear. Question if related to the large sigmoid colon polyp. 5. Neck masses-PET scan 02/28/2014 showed low metabolic activity associated with a large neck mass. The  large neck mass appeared to be centered in the thyroid gland and was felt to likely represent a thyroid goiter.    Disposition: Christine Armstrong appears stable. Dr. Benay Spice reviewed the PET scan results/images with her and her daughter. She understands she is not a surgical candidate. Dr. Benay Spice discussed options to include supportive/comfort care, chemotherapy, chemotherapy/radiation with a goal of palliation of the bleeding.  She declines chemotherapy and radiation. She would like to focus on a supportive care approach.  She will continue oral iron. She is agreeable to return for a followup visit with a CBC in 4 weeks. She or her daughter will contact the office in the interim with any problems.  Patient seen with Dr. Benay Spice.     Ned Card ANP/GNP-BC   03/01/2014  4:34 PM   This was a share visit with Ned Card.   I reviewed the PET scan images with Christine Armstrong and her daughter. She is not a surgical candidate. We discussed palliative chemotherapy, chemotherapy/radiation, and supportive care. She favors a supportive care approach. She will continue iron and antiacid therapy. She will try Imodium for diarrhea. Christine Armstrong will return for an office visit in one month.  Julieanne Manson, M.D.

## 2014-03-02 ENCOUNTER — Telehealth: Payer: Self-pay | Admitting: Oncology

## 2014-03-02 ENCOUNTER — Telehealth: Payer: Self-pay | Admitting: *Deleted

## 2014-03-02 NOTE — Telephone Encounter (Signed)
Noted. Thanks.  Lenard Galloway RN

## 2014-03-02 NOTE — Telephone Encounter (Signed)
Dr Hilarie Fredrickson,  Did you want any recall on this patient?  Please advise.  Thanks,  Lelan Pons

## 2014-03-02 NOTE — Telephone Encounter (Signed)
No recall, patient with gastric cancer and per oncology notes going with a palliative approach

## 2014-03-02 NOTE — Telephone Encounter (Signed)
no vm.....mailed pt appt sched/avs and letter °

## 2014-03-29 ENCOUNTER — Other Ambulatory Visit (HOSPITAL_BASED_OUTPATIENT_CLINIC_OR_DEPARTMENT_OTHER): Payer: PRIVATE HEALTH INSURANCE

## 2014-03-29 ENCOUNTER — Ambulatory Visit (HOSPITAL_BASED_OUTPATIENT_CLINIC_OR_DEPARTMENT_OTHER): Payer: PRIVATE HEALTH INSURANCE | Admitting: Nurse Practitioner

## 2014-03-29 ENCOUNTER — Telehealth: Payer: Self-pay | Admitting: Nurse Practitioner

## 2014-03-29 VITALS — BP 140/66 | HR 77 | Temp 98.2°F | Resp 18 | Ht 62.0 in | Wt 98.5 lb

## 2014-03-29 DIAGNOSIS — C169 Malignant neoplasm of stomach, unspecified: Secondary | ICD-10-CM

## 2014-03-29 LAB — CBC WITH DIFFERENTIAL/PLATELET
BASO%: 0.2 % (ref 0.0–2.0)
Basophils Absolute: 0 10*3/uL (ref 0.0–0.1)
EOS%: 0.6 % (ref 0.0–7.0)
Eosinophils Absolute: 0 10*3/uL (ref 0.0–0.5)
HCT: 28.4 % — ABNORMAL LOW (ref 34.8–46.6)
HGB: 9 g/dL — ABNORMAL LOW (ref 11.6–15.9)
LYMPH#: 0.6 10*3/uL — AB (ref 0.9–3.3)
LYMPH%: 13.8 % — ABNORMAL LOW (ref 14.0–49.7)
MCH: 25.1 pg (ref 25.1–34.0)
MCHC: 31.7 g/dL (ref 31.5–36.0)
MCV: 79.1 fL — AB (ref 79.5–101.0)
MONO#: 0.6 10*3/uL (ref 0.1–0.9)
MONO%: 12.3 % (ref 0.0–14.0)
NEUT#: 3.4 10*3/uL (ref 1.5–6.5)
NEUT%: 73.1 % (ref 38.4–76.8)
Platelets: 223 10*3/uL (ref 145–400)
RBC: 3.59 10*6/uL — ABNORMAL LOW (ref 3.70–5.45)
RDW: 23.2 % — AB (ref 11.2–14.5)
WBC: 4.7 10*3/uL (ref 3.9–10.3)

## 2014-03-29 LAB — HOLD TUBE, BLOOD BANK

## 2014-03-29 NOTE — Telephone Encounter (Signed)
, °

## 2014-03-29 NOTE — Progress Notes (Signed)
  [place] OFFICE PROGRESS NOTE   Diagnosis:  Gastric cancer  INTERVAL HISTORY:   Christine Armstrong returns as scheduled. Overall she feels well. She denies pain. She continues to have a good appetite. No nausea or vomiting. She has 2-3 loose stools a day. The loose stools tend to occur after eating.   Objective:  Vital signs in last 24 hours:  Blood pressure 140/66, pulse 77, temperature 98.2 F (36.8 C), temperature source Oral, resp. rate 18, height 5\' 2"  (1.575 m), weight 98 lb 8 oz (44.679 kg), SpO2 100.00%.    HEENT: No thrush or ulcers. 3-4 cm left neck mass. Resp: Lungs clear bilaterally. Cardio: Regular rate and rhythm. GI: Abdomen soft and nontender. No organomegaly. No mass. Vascular: No leg edema.  Lab Results:  Lab Results  Component Value Date   WBC 4.7 03/29/2014   HGB 9.0* 03/29/2014   HCT 28.4* 03/29/2014   MCV 79.1* 03/29/2014   PLT 223 03/29/2014   NEUTROABS 3.4 03/29/2014    Imaging:  No results found.  Medications: I have reviewed the patient's current medications.  Assessment/Plan: 1. Gastric cancer PET scan 02/28/2014 with a hypermetabolic mass in the gastric cardia region, hypermetabolic nodal metastasis to 3 gastrohepatic ligament lymph nodes, suspicion for hypermetabolic metastasis to a right hilar lymph node, enlargement of the thyroid gland with low metabolic activity felt to represent a benign goiter. Palliative chemotherapy/radiation declined. Supportive care approach elected. 2. Iron deficiency anemia secondary to #1. 3. Multiple colon polyps (tubular adenomas) on a colonoscopy 02/14/2014 including a large sigmoid colon polyp that was not removed  4. Diarrhea-etiology unclear. Question if related to the large sigmoid colon polyp. 5. Neck masses-PET scan 02/28/2014 showed low metabolic activity associated with a large neck mass. The large neck mass appeared to be centered in the thyroid gland and was felt to likely represent a thyroid  goiter.   Disposition: Christine Armstrong appears stable. Plan to continue to follow on a supportive care approach. She will return for a followup visit and CBC in 4 weeks. She will contact the office in the interim with any problems.    Ned Card ANP/GNP-BC   03/29/2014  3:59 PM

## 2014-04-23 ENCOUNTER — Other Ambulatory Visit: Payer: Self-pay | Admitting: *Deleted

## 2014-04-23 DIAGNOSIS — I10 Essential (primary) hypertension: Secondary | ICD-10-CM

## 2014-04-23 MED ORDER — AMLODIPINE BESYLATE 5 MG PO TABS: 5.0000 mg | ORAL_TABLET | Freq: Every day | ORAL | Status: DC

## 2014-04-24 ENCOUNTER — Ambulatory Visit (HOSPITAL_BASED_OUTPATIENT_CLINIC_OR_DEPARTMENT_OTHER): Payer: PRIVATE HEALTH INSURANCE | Admitting: Oncology

## 2014-04-24 ENCOUNTER — Other Ambulatory Visit (HOSPITAL_BASED_OUTPATIENT_CLINIC_OR_DEPARTMENT_OTHER): Payer: PRIVATE HEALTH INSURANCE

## 2014-04-24 ENCOUNTER — Telehealth: Payer: Self-pay | Admitting: Oncology

## 2014-04-24 VITALS — BP 135/72 | HR 72 | Temp 97.1°F | Resp 18 | Ht 62.0 in | Wt 97.1 lb

## 2014-04-24 DIAGNOSIS — D649 Anemia, unspecified: Secondary | ICD-10-CM

## 2014-04-24 DIAGNOSIS — C169 Malignant neoplasm of stomach, unspecified: Secondary | ICD-10-CM

## 2014-04-24 DIAGNOSIS — C772 Secondary and unspecified malignant neoplasm of intra-abdominal lymph nodes: Secondary | ICD-10-CM

## 2014-04-24 LAB — CBC WITH DIFFERENTIAL/PLATELET
BASO%: 0.4 % (ref 0.0–2.0)
Basophils Absolute: 0 10*3/uL (ref 0.0–0.1)
EOS%: 0.8 % (ref 0.0–7.0)
Eosinophils Absolute: 0 10*3/uL (ref 0.0–0.5)
HCT: 28.4 % — ABNORMAL LOW (ref 34.8–46.6)
HGB: 8.9 g/dL — ABNORMAL LOW (ref 11.6–15.9)
LYMPH%: 15.8 % (ref 14.0–49.7)
MCH: 26.2 pg (ref 25.1–34.0)
MCHC: 31.4 g/dL — AB (ref 31.5–36.0)
MCV: 83.5 fL (ref 79.5–101.0)
MONO#: 0.7 10*3/uL (ref 0.1–0.9)
MONO%: 13.4 % (ref 0.0–14.0)
NEUT#: 3.7 10*3/uL (ref 1.5–6.5)
NEUT%: 69.6 % (ref 38.4–76.8)
Platelets: 241 10*3/uL (ref 145–400)
RBC: 3.41 10*6/uL — AB (ref 3.70–5.45)
RDW: 27.5 % — ABNORMAL HIGH (ref 11.2–14.5)
WBC: 5.2 10*3/uL (ref 3.9–10.3)
lymph#: 0.8 10*3/uL — ABNORMAL LOW (ref 0.9–3.3)

## 2014-04-24 LAB — HOLD TUBE, BLOOD BANK

## 2014-04-24 NOTE — Telephone Encounter (Signed)
Gave avs & cal for Dec. °

## 2014-04-24 NOTE — Progress Notes (Signed)
  [place] OFFICE PROGRESS NOTE   Diagnosis: gastric cancer  INTERVAL HISTORY:   Christine Armstrong returns as scheduled. She reports improvement in the diarrhea when she took Imodium. No significant diarrhea at present. No bleeding. Good appetite. No specific complaint.  Objective:  Vital signs in last 24 hours:  Blood pressure 135/72, pulse 72, temperature 97.1 F (36.2 C), temperature source Oral, resp. rate 18, height 5\' 2"  (1.575 m), weight 97 lb 1.6 oz (44.044 kg).    HEENT: mobile 3-4 cm mass at the left lower neck Resp: lungs clear bilaterally Cardio: regular rate and rhythm GI: no hepatomegaly, nontender, no mass Vascular: no leg edema   Lab Results:  Lab Results  Component Value Date   WBC 5.2 04/24/2014   HGB 8.9* 04/24/2014   HCT 28.4* 04/24/2014   MCV 83.5 04/24/2014   PLT 241 04/24/2014   NEUTROABS 3.7 04/24/2014     Medications: I have reviewed the patient's current medications.  Assessment/Plan: 1. Gastric cancer  PET scan 02/28/2014 with a hypermetabolic mass in the gastric cardia region, hypermetabolic nodal metastasis to 3 gastrohepatic ligament lymph nodes, suspicion for hypermetabolic metastasis to a right hilar lymph node, enlargement of the thyroid gland with low metabolic activity felt to represent a benign goiter.  Palliative chemotherapy/radiation declined.  Supportive care approach elected. 2. Iron deficiency anemia secondary to #1. 3. Multiple colon polyps (tubular adenomas) on a colonoscopy 02/14/2014 including a large sigmoid colon polyp that was not removed  4. Diarrhea-etiology unclear. Question if related to the large sigmoid colon polyp.improved. 5. Neck masses-PET scan 02/28/2014 showed low metabolic activity associated with a large neck mass. The large neck mass appeared to be centered in the thyroid gland and was felt to likely represent a thyroid goiter.   Disposition:  Christine Armstrong appears stable. She will return for  an office visit and CBC in 6 weeks. She will contact us in the interim for new symptoms.  Betsy Coder, MD  04/24/2014  4:06 PM

## 2014-06-04 ENCOUNTER — Ambulatory Visit (HOSPITAL_BASED_OUTPATIENT_CLINIC_OR_DEPARTMENT_OTHER): Payer: PRIVATE HEALTH INSURANCE | Admitting: Nurse Practitioner

## 2014-06-04 ENCOUNTER — Telehealth: Payer: Self-pay | Admitting: Nurse Practitioner

## 2014-06-04 ENCOUNTER — Other Ambulatory Visit: Payer: Self-pay | Admitting: *Deleted

## 2014-06-04 ENCOUNTER — Other Ambulatory Visit (HOSPITAL_BASED_OUTPATIENT_CLINIC_OR_DEPARTMENT_OTHER): Payer: PRIVATE HEALTH INSURANCE

## 2014-06-04 VITALS — BP 145/59 | HR 84 | Temp 98.7°F | Resp 18 | Ht 62.0 in | Wt 94.2 lb

## 2014-06-04 DIAGNOSIS — C169 Malignant neoplasm of stomach, unspecified: Secondary | ICD-10-CM

## 2014-06-04 DIAGNOSIS — D509 Iron deficiency anemia, unspecified: Secondary | ICD-10-CM

## 2014-06-04 DIAGNOSIS — D649 Anemia, unspecified: Secondary | ICD-10-CM

## 2014-06-04 DIAGNOSIS — Z23 Encounter for immunization: Secondary | ICD-10-CM

## 2014-06-04 LAB — CBC WITH DIFFERENTIAL/PLATELET
BASO%: 0.4 % (ref 0.0–2.0)
BASOS ABS: 0 10*3/uL (ref 0.0–0.1)
EOS ABS: 0 10*3/uL (ref 0.0–0.5)
EOS%: 0.4 % (ref 0.0–7.0)
HEMATOCRIT: 29.6 % — AB (ref 34.8–46.6)
HEMOGLOBIN: 9.5 g/dL — AB (ref 11.6–15.9)
LYMPH%: 15.1 % (ref 14.0–49.7)
MCH: 28.5 pg (ref 25.1–34.0)
MCHC: 32.1 g/dL (ref 31.5–36.0)
MCV: 88.8 fL (ref 79.5–101.0)
MONO#: 0.7 10*3/uL (ref 0.1–0.9)
MONO%: 11.2 % (ref 0.0–14.0)
NEUT#: 4.3 10*3/uL (ref 1.5–6.5)
NEUT%: 72.9 % (ref 38.4–76.8)
Platelets: 285 10*3/uL (ref 145–400)
RBC: 3.33 10*6/uL — ABNORMAL LOW (ref 3.70–5.45)
RDW: 20.6 % — ABNORMAL HIGH (ref 11.2–14.5)
WBC: 6 10*3/uL (ref 3.9–10.3)
lymph#: 0.9 10*3/uL (ref 0.9–3.3)

## 2014-06-04 MED ORDER — INFLUENZA VAC SPLIT QUAD 0.5 ML IM SUSY
0.5000 mL | PREFILLED_SYRINGE | Freq: Once | INTRAMUSCULAR | Status: AC
  Administered 2014-06-04: 0.5 mL via INTRAMUSCULAR
  Filled 2014-06-04: qty 0.5

## 2014-06-04 NOTE — Progress Notes (Signed)
  [place] OFFICE PROGRESS NOTE   Diagnosis:  Gastric cancer   INTERVAL HISTORY:   Ms. Masella returns as scheduled. The diarrhea is better. She denies bleeding. No abdominal pain. She continues to have a good appetite. No nausea or vomiting.  Objective:  Vital signs in last 24 hours:  Blood pressure 145/59, pulse 84, temperature 98.7 F (37.1 C), temperature source Oral, resp. rate 18, height 5\' 2"  (1.575 m), weight 94 lb 3.2 oz (42.729 kg), SpO2 100 %.    HEENT: no thrush or ulcers. Stable 3-4 cm left lower neck mass. Resp: lungs clear bilaterally. Cardio: regular rate and rhythm. GI: abdomen soft and nontender. No hepatomegaly. No mass. Vascular: no leg edema.    Lab Results:  Lab Results  Component Value Date   WBC 6.0 06/04/2014   HGB 9.5* 06/04/2014   HCT 29.6* 06/04/2014   MCV 88.8 06/04/2014   PLT 285 06/04/2014   NEUTROABS 4.3 06/04/2014    Imaging:  No results found.  Medications: I have reviewed the patient's current medications.  Assessment/Plan: 1. Gastric cancer  PET scan 02/28/2014 with a hypermetabolic mass in the gastric cardia region, hypermetabolic nodal metastasis to 3 gastrohepatic ligament lymph nodes, suspicion for hypermetabolic metastasis to a right hilar lymph node, enlargement of the thyroid gland with low metabolic activity felt to represent a benign goiter.  Palliative chemotherapy/radiation declined.  Supportive care approach elected. 2. Iron deficiency anemia secondary to #1. 3. Multiple colon polyps (tubular adenomas) on a colonoscopy 02/14/2014 including a large sigmoid colon polyp that was not removed  4. Diarrhea-etiology unclear. Question if related to the large sigmoid colon polyp. Improved. 5. Neck masses-PET scan 02/28/2014 showed low metabolic activity associated with a large neck mass. The large neck mass appeared to be centered in the thyroid gland and was felt to likely represent a thyroid  goiter.   Disposition: Ms. Aerts appears stable. She will return for a followup visit and CBC in 6 weeks. She will contact the office in the interim with any problems.    Ned Card ANP/GNP-BC   06/04/2014  4:04 PM

## 2014-06-04 NOTE — Telephone Encounter (Signed)
Gave avs & cal for Jan. °

## 2014-06-05 ENCOUNTER — Encounter: Payer: Self-pay | Admitting: Oncology

## 2014-06-05 NOTE — Progress Notes (Signed)
Put fmla form on nurse's desk °

## 2014-06-08 ENCOUNTER — Encounter: Payer: Self-pay | Admitting: Oncology

## 2014-06-08 NOTE — Progress Notes (Signed)
Faxed daughter's fmla form to [employer] @ [phone] °

## 2014-06-21 ENCOUNTER — Other Ambulatory Visit: Payer: Self-pay | Admitting: Internal Medicine

## 2014-06-27 ENCOUNTER — Inpatient Hospital Stay (HOSPITAL_COMMUNITY)
Admission: EM | Admit: 2014-06-27 | Discharge: 2014-06-28 | DRG: 375 | Disposition: A | Discharge status: Home / Self Care | Payer: Medicare Other | Attending: Family Medicine | Admitting: Family Medicine

## 2014-06-27 ENCOUNTER — Encounter (HOSPITAL_COMMUNITY): Payer: Self-pay | Admitting: Emergency Medicine

## 2014-06-27 DIAGNOSIS — I129 Hypertensive chronic kidney disease with stage 1 through stage 4 chronic kidney disease, or unspecified chronic kidney disease: Secondary | ICD-10-CM | POA: Diagnosis not present

## 2014-06-27 DIAGNOSIS — N183 Chronic kidney disease, stage 3 (moderate): Secondary | ICD-10-CM | POA: Diagnosis not present

## 2014-06-27 DIAGNOSIS — Z515 Encounter for palliative care: Secondary | ICD-10-CM

## 2014-06-27 DIAGNOSIS — R531 Weakness: Secondary | ICD-10-CM

## 2014-06-27 DIAGNOSIS — R638 Other symptoms and signs concerning food and fluid intake: Secondary | ICD-10-CM | POA: Diagnosis not present

## 2014-06-27 DIAGNOSIS — K922 Gastrointestinal hemorrhage, unspecified: Secondary | ICD-10-CM | POA: Diagnosis present

## 2014-06-27 DIAGNOSIS — K921 Melena: Secondary | ICD-10-CM | POA: Diagnosis not present

## 2014-06-27 DIAGNOSIS — E049 Nontoxic goiter, unspecified: Secondary | ICD-10-CM | POA: Diagnosis not present

## 2014-06-27 DIAGNOSIS — Z87891 Personal history of nicotine dependence: Secondary | ICD-10-CM | POA: Diagnosis not present

## 2014-06-27 DIAGNOSIS — C169 Malignant neoplasm of stomach, unspecified: Secondary | ICD-10-CM | POA: Diagnosis not present

## 2014-06-27 DIAGNOSIS — Z66 Do not resuscitate: Secondary | ICD-10-CM | POA: Diagnosis not present

## 2014-06-27 DIAGNOSIS — M810 Age-related osteoporosis without current pathological fracture: Secondary | ICD-10-CM | POA: Diagnosis present

## 2014-06-27 DIAGNOSIS — D649 Anemia, unspecified: Secondary | ICD-10-CM | POA: Diagnosis present

## 2014-06-27 HISTORY — DX: Malignant (primary) neoplasm, unspecified: C80.1

## 2014-06-27 LAB — COMPREHENSIVE METABOLIC PANEL
ALT: 12 U/L (ref 0–35)
ANION GAP: 4 — AB (ref 5–15)
AST: 63 U/L — ABNORMAL HIGH (ref 0–37)
Albumin: 2.9 g/dL — ABNORMAL LOW (ref 3.5–5.2)
Alkaline Phosphatase: 72 U/L (ref 39–117)
BUN: 27 mg/dL — ABNORMAL HIGH (ref 6–23)
CO2: 25 mmol/L (ref 19–32)
Calcium: 8.1 mg/dL — ABNORMAL LOW (ref 8.4–10.5)
Chloride: 101 mEq/L (ref 96–112)
Creatinine, Ser: 1.66 mg/dL — ABNORMAL HIGH (ref 0.50–1.10)
GFR calc non Af Amer: 26 mL/min — ABNORMAL LOW (ref >90)
GFR, EST AFRICAN AMERICAN: 31 mL/min — AB (ref >90)
GLUCOSE: 101 mg/dL — AB (ref 70–99)
POTASSIUM: 5.4 mmol/L — AB (ref 3.5–5.1)
Sodium: 130 mmol/L — ABNORMAL LOW (ref 135–145)
TOTAL PROTEIN: 6.4 g/dL (ref 6.0–8.3)
Total Bilirubin: 0.9 mg/dL (ref 0.3–1.2)

## 2014-06-27 LAB — CBC
HCT: 30.6 % — ABNORMAL LOW (ref 36.0–46.0)
HEMOGLOBIN: 10 g/dL — AB (ref 12.0–15.0)
MCH: 29.9 pg (ref 26.0–34.0)
MCHC: 32.7 g/dL (ref 30.0–36.0)
MCV: 91.3 fL (ref 78.0–100.0)
PLATELETS: 304 10*3/uL (ref 150–400)
RBC: 3.35 MIL/uL — ABNORMAL LOW (ref 3.87–5.11)
RDW: 15.8 % — AB (ref 11.5–15.5)
WBC: 4.5 10*3/uL (ref 4.0–10.5)

## 2014-06-27 LAB — TYPE AND SCREEN
ABO/RH(D): O POS
Antibody Screen: NEGATIVE

## 2014-06-27 LAB — LIPASE, BLOOD: Lipase: 32 U/L (ref 11–59)

## 2014-06-27 MED ORDER — ONDANSETRON HCL 4 MG/2ML IJ SOLN: 4.0000 mg | Freq: Four times a day (QID) | INTRAMUSCULAR | Status: DC | PRN

## 2014-06-27 MED ORDER — SODIUM CHLORIDE 0.9 % IV SOLN: INTRAVENOUS | Status: DC

## 2014-06-27 MED ORDER — PANTOPRAZOLE SODIUM 40 MG IV SOLR
40.0000 mg | Freq: Two times a day (BID) | INTRAVENOUS | Status: DC
  Administered 2014-06-28: 40 mg via INTRAVENOUS
  Filled 2014-06-27 (×2): qty 40

## 2014-06-27 MED ORDER — SODIUM CHLORIDE 0.9 % IV BOLUS (SEPSIS)
1000.0000 mL | Freq: Once | INTRAVENOUS | Status: AC
  Administered 2014-06-27: 1000 mL via INTRAVENOUS

## 2014-06-27 MED ORDER — ONDANSETRON HCL 4 MG PO TABS: 4.0000 mg | ORAL_TABLET | Freq: Four times a day (QID) | ORAL | Status: DC | PRN

## 2014-06-27 MED ORDER — HYDROMORPHONE HCL 1 MG/ML IJ SOLN: 0.5000 mg | Freq: Once | INTRAMUSCULAR | Status: DC

## 2014-06-27 MED ORDER — CARVEDILOL 12.5 MG PO TABS
12.5000 mg | ORAL_TABLET | Freq: Two times a day (BID) | ORAL | Status: DC
  Administered 2014-06-28: 12.5 mg via ORAL
  Filled 2014-06-27 (×2): qty 1

## 2014-06-27 MED ORDER — ACETAMINOPHEN 650 MG RE SUPP: 650.0000 mg | Freq: Four times a day (QID) | RECTAL | Status: DC | PRN

## 2014-06-27 MED ORDER — ONDANSETRON HCL 4 MG/2ML IJ SOLN: 4.0000 mg | Freq: Once | INTRAMUSCULAR | Status: DC

## 2014-06-27 MED ORDER — SODIUM CHLORIDE 0.9 % IV BOLUS (SEPSIS)
500.0000 mL | Freq: Once | INTRAVENOUS | Status: AC
  Administered 2014-06-27: 500 mL via INTRAVENOUS

## 2014-06-27 MED ORDER — INTEGRA PLUS PO CAPS: 1.0000 | ORAL_CAPSULE | Freq: Every day | ORAL | Status: DC

## 2014-06-27 MED ORDER — ACETAMINOPHEN 325 MG PO TABS: 650.0000 mg | ORAL_TABLET | Freq: Four times a day (QID) | ORAL | Status: DC | PRN

## 2014-06-27 MED ORDER — SODIUM CHLORIDE 0.9 % IV SOLN
80.0000 mg | Freq: Once | INTRAVENOUS | Status: AC
  Administered 2014-06-27: 80 mg via INTRAVENOUS
  Filled 2014-06-27: qty 80

## 2014-06-27 MED ORDER — SODIUM CHLORIDE 0.9 % IV SOLN
INTRAVENOUS | Status: AC
  Administered 2014-06-28: 01:00:00 via INTRAVENOUS

## 2014-06-27 MED ORDER — SIMETHICONE 40 MG/0.6ML PO SUSP
40.0000 mg | Freq: Four times a day (QID) | ORAL | Status: DC | PRN
  Filled 2014-06-27: qty 0.6

## 2014-06-27 NOTE — ED Provider Notes (Addendum)
CSN: [id number]     Arrival date & time 06/27/14  61 History   First MD Initiated Contact with Patient 06/27/14 1650     Chief Complaint  Patient presents with  . Rectal Bleeding     (Consider location/radiation/quality/duration/timing/severity/associated sxs/prior Treatment) Patient is a 81 y.o. female presenting with hematochezia. The history is provided by the patient.  Rectal Bleeding Associated symptoms: no abdominal pain and no fever   Patient with hx gastric cancer presents w rectal bleeding/black stools in the past day. Feels generally weak. Denies syncope. Nausea, and states also vomiting some very dark, almost black material. No abd distension. Has had epigastric pain recurrently in past related to gastric cancer, but denies currently. No cp or discomfort. No sob. No fever or chills. Denies hx pud.  Takes a baby asa a day. Denies other nsaid or anticoagulant use.  w gastric cancer, has decided on no treatment.  States has had progressive wt loss over the course of the past couple of years.      Past Medical History  Diagnosis Date  . CKD (chronic kidney disease) stage 3, GFR 30-59 ml/min   . HTN (hypertension)   . Benign skin lesion   . Osteoporosis   . H. pylori infection   . Anemia   . History of blood transfusion   . Cancer     stomach   Past Surgical History  Procedure Laterality Date  . Neg hx     Family History  Problem Relation Age of Onset  . Hypertension Brother   . Colon cancer Neg Hx   . Liver cancer Mother   . Cancer Father     ? type   History  Substance Use Topics  . Smoking status: Never Smoker   . Smokeless tobacco: Former Systems developer     Comment: quit 20 years ago  . Alcohol Use: No   OB History    No data available     Review of Systems  Constitutional: Negative for fever and chills.  HENT: Negative for sore throat.   Eyes: Negative for redness.  Respiratory: Negative for shortness of breath.   Cardiovascular: Negative for chest pain.   Gastrointestinal: Positive for hematochezia. Negative for abdominal pain, diarrhea and constipation.  Endocrine: Negative for polyuria.  Genitourinary: Negative for hematuria and flank pain.  Musculoskeletal: Negative for back pain and neck pain.  Skin: Negative for rash.  Neurological: Positive for weakness. Negative for headaches.  Hematological: Does not bruise/bleed easily.  Psychiatric/Behavioral: Negative for confusion.      Allergies  Ace inhibitors  Home Medications   Prior to Admission medications   Medication Sig Start Date End Date Taking? Authorizing Provider  amLODipine (NORVASC) 5 MG tablet Take 1 tablet (5 mg total) by mouth daily. 04/23/14   Leone Brand, MD  carvedilol (COREG) 12.5 MG tablet Take 12.5 mg by mouth 2 (two) times daily with a meal. 06/07/13   Waldemar Dickens, MD  FeFum-FePoly-FA-B Cmp-C-Biot (INTEGRA PLUS) CAPS TAKE ONE CAPSULE BY MOUTH DAILY 06/21/14   Jerene Bears, MD  omeprazole (PRILOSEC) 40 MG capsule Take 1 capsule (40 mg total) by mouth daily. 02/14/14   Jerene Bears, MD   BP 133/78 mmHg  Pulse 102  Temp(Src) 97.3 F (36.3 C) (Oral)  Resp 18  SpO2 99% Physical Exam  Constitutional:  Very thin appearing.   HENT:  Mouth/Throat: Oropharynx is clear and moist.  Eyes: Conjunctivae are normal. No scleral icterus.  Neck: Neck supple.  No tracheal deviation present.  Cardiovascular: Normal rate, regular rhythm, normal heart sounds and intact distal pulses.   Pulmonary/Chest: Effort normal and breath sounds normal. No respiratory distress.  Abdominal: Soft. Normal appearance and bowel sounds are normal. She exhibits no distension. There is no tenderness. There is no rebound.  Genitourinary:  No cva tenderness. V dark brown, almost black stool, liquidy.   Musculoskeletal: She exhibits no edema or tenderness.  Neurological: She is alert.  Skin: Skin is warm and dry. No rash noted.  Psychiatric: She has a normal mood and affect.  Nursing note  and vitals reviewed.   ED Course  Procedures (including critical care time) Labs Review     MDM  Iv ns. Labs.  protonix iv.  Pt declines wanting/needing any pain medication.  Stool black, heme pos (although result not crossing over on epic).  Recheck abd soft nt.  Given recurrent black stools and ?episode coffee ground emesis prior to arrival, gen weakness, lightheaded when stands, will admit to med for obs/recheck hgb.  pcp is [facility] - will call for admit/transfer.      Mirna Mires, MD 06/27/14 [phone]

## 2014-06-27 NOTE — H&P (Signed)
[facility] Admission History and Physical Service Pager: [phone]  Patient name: Christine Armstrong Medical record number: [id number] Date of birth: May 31, 1924 Age: 81 y.o. Gender: female  Primary Care Provider: Tawanna Sat, MD Consultants: none Code Status: DNR (confirmed with patient)  Chief Complaint: dark stools  Assessment and Plan: Christine Armstrong is a 81 y.o. female presenting with 2 week history of worsening dark stools and nausea/vomiting episode today . PMH is significant for gastric cancer (decided against treatment), HTN, CKD, Anemia requiring transfusion  # Suspected GI bleed: pt stable currently, no further episodes of vomiting or diarrhea. BP actually elevated up to 856D systolic upon arrival to [facility]. Hgb 10.0 which is above apparent baseline of 9-9.5. Received 1.5L NS boluses in ED. FOBT positive in ED. - admit to med-surg - continue IVF, NS @ 100cc/hr overnight - recheck hgb in AM - IV protonix BID - tylenol PRN for pain, if needed will add additional order - zofran PRN for nausea - simethicone drops for gas - diet NPO overnight, advance in morning if still stable - orthostatics in the morning - consider palliative for goals of care, does not appear to have been discussed yet.  # Gastric cancer: followed by Dr. Benay Spice with oncology. Pt has elected not to undergo treatment. This is likely the source of any possible GI bleed.   # Hypertension: normotensive in ED but elevated with first vitals here - hold amlodipine - continue coreg at home dose  # CKD: stable, Cr 1.66 which is near baseline - repeat bmet in AM  # Thyroid goiter: per pt this is chronic, non-painful. Low metabolic activity seen on PET scan, thought to represent goiter. - no further workup  FEN/GI: NPO, NS @ 100cc/hr Prophylaxis: SCDs  Disposition: admit   History of Present Illness: Christine Armstrong is a 81 y.o. female presenting with 2 weeks history of worsening dark  stools and onset of weakness/dizziness x 2 days. Pt reports chronic history of diarrhea and dark stools, but has noted it to be getting worse over last 2 weeks. This morning aroudn 9am she felt very nauseas and weak, and vomited. She does not report any blood in the vomit, says it was only the food she ate. She denies frank blood in her stool. She denies any pain, denies chest pain, sob, heart racing. She does feel dizzy primarily when standing up. She also complains of being more gassy lately.  Pt was diagnosed with gastric cancer in August 2015 following progressive weight loss, anemia. She has elected to not undergo radiation, chemo, or surgery. She has been followed closely by Dr. Benay Spice with oncology. Anemia has been an issue for her and she has been getting iron supplements.   Review Of Systems: Per HPI with the following additions: weight loss Otherwise 12 point review of systems was performed and was unremarkable.  Patient Active Problem List   Diagnosis Date Noted  . Gastric cancer 02/21/2014  . Diarrhea 02/01/2014  . Loss of weight 02/01/2014  . Fecal incontinence 02/01/2014  . Anemia, unspecified 02/01/2014  . Abdominal pain, unspecified site 12/15/2013  . Loose stools 06/07/2013  . Dizziness 06/07/2013  . Goals of care, counseling/discussion 06/07/2013  . Hyperglycemia 09/08/2011  . HYPERTENSION, BENIGN SYSTEMIC 08/19/2006  . CKD (chronic kidney disease) stage 3, GFR 30-59 ml/min 08/19/2006  . OSTEOPENIA 08/19/2006   Past Medical History: Past Medical History  Diagnosis Date  . CKD (chronic kidney disease) stage 3, GFR 30-59 ml/min   .  HTN (hypertension)   . Benign skin lesion   . Osteoporosis   . H. pylori infection   . Anemia   . History of blood transfusion   . Cancer     stomach   Past Surgical History: Past Surgical History  Procedure Laterality Date  . Neg hx     Social History: History  Substance Use Topics  . Smoking status: Never Smoker   .  Smokeless tobacco: Former Systems developer     Comment: quit 20 years ago  . Alcohol Use: No   Additional social history: none  Please also refer to relevant sections of EMR.  Family History: Family History  Problem Relation Age of Onset  . Hypertension Brother   . Colon cancer Neg Hx   . Liver cancer Mother   . Cancer Father     ? type   Allergies and Medications: Allergies  Allergen Reactions  . Ace Inhibitors     REACTION: angioedema   No current facility-administered medications on file prior to encounter.   Current Outpatient Prescriptions on File Prior to Encounter  Medication Sig Dispense Refill  . amLODipine (NORVASC) 5 MG tablet Take 1 tablet (5 mg total) by mouth daily. 90 tablet 3  . carvedilol (COREG) 12.5 MG tablet Take 12.5 mg by mouth 2 (two) times daily with a meal.    . FeFum-FePoly-FA-B Cmp-C-Biot (INTEGRA PLUS) CAPS TAKE ONE CAPSULE BY MOUTH DAILY 30 capsule 3  . omeprazole (PRILOSEC) 40 MG capsule Take 1 capsule (40 mg total) by mouth daily. 30 capsule 12    Objective: BP 145/72 mmHg  Pulse 73  Temp(Src) 97.3 F (36.3 C) (Oral)  Resp 18  SpO2 98% Exam: General: NAD, elderly thin female, laying in bed, children in room HEENT: PERRL, EOMI, conjunctiva slightly pale. MM mildly dry. Approx 1.5cm thyroid goiter present Cardiovascular: RRR, normal s1 and s2, no murmur. 2+ radial pulses bilat Respiratory: clear bilaterally Abdomen: soft, thin, mildly tender epigastrium, no rebound or guarding. Normal bowel sounds Extremities: no edema or cyanosis, wwp Skin: no rashes Neuro: alert and oriented, no focal deficits.  Labs and Imaging: CBC BMET   Recent Labs Lab 06/27/14 1719  WBC 4.5  HGB 10.0*  HCT 30.6*  PLT 304    Recent Labs Lab 06/27/14 1719  NA 130*  K 5.4*  CL 101  CO2 25  BUN 27*  CREATININE 1.66*  GLUCOSE 101*  CALCIUM 8.1*      Leone Brand, MD 06/27/2014, 7:34 PM PGY-2, [place] Intern pager: [phone], text  pages welcome

## 2014-06-27 NOTE — ED Notes (Addendum)
Pt reports hx stomach CA, placed on medication in September and was told this may change her stool color. Pt has remained having black stools since that time. Pt states however, today she vomited for the first time since her dx. Pt states she only had abd pain before she vomited, denies now. Pt states that she has been dizzy for the past 3 days.

## 2014-06-27 NOTE — Progress Notes (Signed)
Notified Dr. Lamar Benes of pt's arrival to floor in room [phone].  He will be up to see her.

## 2014-06-27 NOTE — ED Notes (Signed)
Attempted to call report to [facility] 6N, nurse unable to take report at this time but states she will call me when ready

## 2014-06-27 NOTE — ED Notes (Signed)
Occult blood positive in stool

## 2014-06-28 DIAGNOSIS — K922 Gastrointestinal hemorrhage, unspecified: Secondary | ICD-10-CM

## 2014-06-28 DIAGNOSIS — R531 Weakness: Secondary | ICD-10-CM

## 2014-06-28 DIAGNOSIS — Z515 Encounter for palliative care: Secondary | ICD-10-CM

## 2014-06-28 DIAGNOSIS — R638 Other symptoms and signs concerning food and fluid intake: Secondary | ICD-10-CM

## 2014-06-28 LAB — BASIC METABOLIC PANEL
ANION GAP: 6 (ref 5–15)
BUN: 19 mg/dL (ref 6–23)
CALCIUM: 7.8 mg/dL — AB (ref 8.4–10.5)
CO2: 24 mmol/L (ref 19–32)
CREATININE: 1.38 mg/dL — AB (ref 0.50–1.10)
Chloride: 109 mEq/L (ref 96–112)
GFR calc Af Amer: 38 mL/min — ABNORMAL LOW (ref >90)
GFR, EST NON AFRICAN AMERICAN: 33 mL/min — AB (ref >90)
Glucose, Bld: 72 mg/dL (ref 70–99)
POTASSIUM: 3.7 mmol/L (ref 3.5–5.1)
SODIUM: 139 mmol/L (ref 135–145)

## 2014-06-28 LAB — CBC
HCT: 23.6 % — ABNORMAL LOW (ref 36.0–46.0)
HCT: 26.1 % — ABNORMAL LOW (ref 36.0–46.0)
HEMOGLOBIN: 7.8 g/dL — AB (ref 12.0–15.0)
Hemoglobin: 8.4 g/dL — ABNORMAL LOW (ref 12.0–15.0)
MCH: 29.2 pg (ref 26.0–34.0)
MCH: 28.7 pg (ref 26.0–34.0)
MCHC: 33.1 g/dL (ref 30.0–36.0)
MCHC: 32.2 g/dL (ref 30.0–36.0)
MCV: 88.4 fL (ref 78.0–100.0)
MCV: 89.1 fL (ref 78.0–100.0)
PLATELETS: 243 10*3/uL (ref 150–400)
Platelets: 243 10*3/uL (ref 150–400)
RBC: 2.67 MIL/uL — ABNORMAL LOW (ref 3.87–5.11)
RBC: 2.93 MIL/uL — AB (ref 3.87–5.11)
RDW: 15.5 % (ref 11.5–15.5)
RDW: 15.5 % (ref 11.5–15.5)
WBC: 3.3 10*3/uL — ABNORMAL LOW (ref 4.0–10.5)
WBC: 3.8 10*3/uL — ABNORMAL LOW (ref 4.0–10.5)

## 2014-06-28 LAB — OCCULT BLOOD, POC DEVICE: FECAL OCCULT BLD: POSITIVE — AB

## 2014-06-28 MED ORDER — PNEUMOCOCCAL VAC POLYVALENT 25 MCG/0.5ML IJ INJ: 0.5000 mL | INJECTION | INTRAMUSCULAR | Status: DC

## 2014-06-28 MED ORDER — CHLORHEXIDINE GLUCONATE 0.12 % MT SOLN: 15.0000 mL | Freq: Two times a day (BID) | OROMUCOSAL | Status: DC

## 2014-06-28 MED ORDER — CETYLPYRIDINIUM CHLORIDE 0.05 % MT LIQD: 7.0000 mL | Freq: Two times a day (BID) | OROMUCOSAL | Status: DC

## 2014-06-28 MED ORDER — INTEGRA PLUS PO CAPS: 1.0000 | ORAL_CAPSULE | Freq: Three times a day (TID) | ORAL | Status: AC

## 2014-06-28 NOTE — Discharge Planning (Signed)
Copy of AVS to pt who verbalizes understanding, daughter also reviewed instructions. Dc'd to private car home with all personal belongings with family.

## 2014-06-28 NOTE — Progress Notes (Signed)
Family Medicine Teaching Service Daily Progress Note Intern Pager: [phone]  Patient name: Christine Armstrong Medical record number: [id number] Date of birth: 1924-01-10 Age: 81 y.o. Gender: female  Primary Care Provider: Tawanna Sat, MD Consultants: Palliative care Code Status: DNR  Pt Overview and Major Events to Date:  1/6 - admit to FPTS for likely GI bleed  Assessment and Plan: Christine Armstrong is a 81 y.o. female presenting with 2 week history of worsening dark stools and nausea/vomiting episode today . PMH is significant for gastric cancer (decided against treatment), HTN, CKD, Anemia requiring transfusion  # Suspected GI bleed: pt stable currently, no further episodes of vomiting or diarrhea. VSS.  Received 1.5L NS boluses in ED. FOBT + in ED. - Hgb 10 > 7.8 (baseline 9-9.5), could be dilutional (all cell lines decreased) - continue IVF, NS @ 100cc/hr overnight - will dc if taking PO well - recheck hgb in afternoon - IV protonix BID - tylenol PRN for pain, if needed will add additional order - zofran PRN for nausea - simethicone drops for gas - diet NPO overnight, will advance as patient is stable - orthostatics to be done today  # Gastric cancer: followed by Dr. Benay Spice with oncology. Pt has elected not to undergo treatment. This is likely the source of any possible GI bleed.  - Will consult palliative care today, for goals of care, does not appear to have been done yet  # Hypertension: normotensive this AM, but hypertensive last night - holding home amlodipine - continue coreg at home dose  # CKD: stable, Cr 1.66>1.38 which is near baseline  # Thyroid goiter: per pt this is chronic, non-painful. Low metabolic activity seen on PET scan, thought to represent goiter. - no further workup  FEN/GI: Clear liquid diet, NS @ 100cc/hr Prophylaxis: SCDs  Disposition: Pending palliative care consult and stabilization of hemoglobin  Subjective:  Is feeling well this AM. Denies  any dizziness.  Wants to eat.  Objective: Temp:  [97.3 F (36.3 C)-98.4 F (36.9 C)] 98.4 F (36.9 C) (01/07 0604) Pulse Rate:  [63-102] 63 (01/07 0604) Resp:  [17-21] 21 (01/07 0604) BP: (124-171)/(59-106) 124/59 mmHg (01/07 0604) SpO2:  [98 %-100 %] 100 % (01/07 0604) Physical Exam: General: NAD, elderly thin female, sitting in chair comfortably HEENT: PERRL, EOMI, conjunctiva slightly pale. MMM. Approx 1.5cm thyroid goiter present Cardiovascular: RRR, normal s1 and s2, no murmur. 2+ radial pulses bilat Respiratory: clear bilaterally Abdomen: soft, thin, NDNT, no rebound or guarding. Normal bowel sounds Extremities: no edema or cyanosis, wwp Neuro: alert and oriented, no focal deficits.  Laboratory:  Recent Labs Lab 06/27/14 1719 06/28/14 0644  WBC 4.5 3.3*  HGB 10.0* 7.8*  HCT 30.6* 23.6*  PLT 304 243    Recent Labs Lab 06/27/14 1719 06/28/14 0644  NA 130* 139  K 5.4* 3.7  CL 101 109  CO2 25 24  BUN 27* 19  CREATININE 1.66* 1.38*  CALCIUM 8.1* 7.8*  PROT 6.4  --   BILITOT 0.9  --   ALKPHOS 72  --   ALT 12  --   AST 63*  --   GLUCOSE 101* 72    Imaging/Diagnostic Tests: none  Lavon Paganini, MD 06/28/2014, 9:55 AM PGY-1, [place] Intern pager: [phone], text pages welcome

## 2014-06-28 NOTE — Discharge Summary (Signed)
[facility] Discharge Summary  Patient name: Christine Armstrong Medical record number: [id number] Date of birth: 08/26/1923 Age: 81 y.o. Gender: female Date of Admission: 06/27/2014  Date of Discharge: 06/28/13 Admitting Physician: Zigmund Gottron, MD  Primary Care Provider: Tawanna Sat, MD Consultants: palliative care - but unable to see prior to discharge  Indication for Hospitalization: progressively darker stools, dark emesis (?coffee-ground)  Discharge Diagnoses/Problem List:  Suspected upper GI Bleed Gastric cancer Hypertension CKD Thyroid goiter  Disposition: home  Discharge Condition: stable  Discharge Exam: See progress note from day of discharge  Brief Hospital Course: Christine Armstrong is a 81 y.o. female presenting with 2 week history of worsening dark stools and nausea/vomiting episode today . PMH is significant for gastric cancer (decided against treatment), HTN, CKD, Anemia requiring transfusion.  # Suspected GI bleed: Patient with known h/o gastric cancer presenting with progressively darker stools x2wks and 1 episode of dark vomit (ED reported coffee ground emesis, patient reported dark in color but with food contents).  Vital signs stable without orthostatic hypotension throughout admission.  Hgb 10.0 on admission (likely hemoconcentrated, as this was above baseline of 9-9.5). Hemoglobin decreased to 7.8 on morning of discharge. Thought to be hemodilutional (all cell lines decreased and patient had received IVF bolus and mIVF) so repeat was obtained.  Repeat Hgb improved to 8.4 without intervention.  Patient was feeling well, tolerating PO, and denied any further episodes of dark stools or vomiting.  # Gastric cancer: Followed by Dr. Benay Spice with oncology. Pt has elected not to undergo treatment. This is likely the source of any possible GI bleed. Palliative care consultation during admission for goals of care discussion, but unable to see  patient before discharge.  # Hypertension: normotensive throughout admission, but initially hypotensive on presentation to ED.  Home amlodipine held throughout admission and on discharge, as patient has high risk of repeat GI bleed leading to hypotension.  Home coreg continued.  All other chronic medical conditions stable throughout admission and managed with home regimens.  Issues for Follow Up:  - f/u Hgb  - consulted palliative care, but they were unable to see her prior to discharge. Consider [place] discussion with PCP and/or re-consulting palliative care if re-admitted - Watch BPs closely. Consider d/c'ing all anti-hypertensives in this patient with high probability of re-GI bleed. Consider risks vs benefits in light of life expectancy  Significant Procedures: None  Significant Labs and Imaging:   Recent Labs Lab 06/27/14 1719 06/28/14 0644 06/28/14 1318  WBC 4.5 3.3* 3.8*  HGB 10.0* 7.8* 8.4*  HCT 30.6* 23.6* 26.1*  PLT 304 243 243    Recent Labs Lab 06/27/14 1719 06/28/14 0644  NA 130* 139  K 5.4* 3.7  CL 101 109  CO2 25 24  GLUCOSE 101* 72  BUN 27* 19  CREATININE 1.66* 1.38*  CALCIUM 8.1* 7.8*  ALKPHOS 72  --   AST 63*  --   ALT 12  --   ALBUMIN 2.9*  --     Results/Tests Pending at Time of Discharge: None  Discharge Medications:    Medication List    STOP taking these medications        amLODipine 5 MG tablet  Commonly known as:  NORVASC     aspirin EC 81 MG tablet      TAKE these medications        carvedilol 12.5 MG tablet  Commonly known as:  COREG  Take 12.5 mg by mouth  2 (two) times daily with a meal.     INTEGRA PLUS Caps  Take 1 capsule by mouth 3 (three) times daily.     loperamide 2 MG tablet  Commonly known as:  IMODIUM A-D  Take 2 mg by mouth 4 (four) times daily as needed for diarrhea or loose stools.     omeprazole 40 MG capsule  Commonly known as:  PRILOSEC  Take 1 capsule (40 mg total) by mouth daily.        Discharge  Instructions: Please refer to Patient Instructions section of EMR for full details.  Patient was counseled important signs and symptoms that should prompt return to medical care, changes in medications, dietary instructions, activity restrictions, and follow up appointments.   Follow-Up Appointments: Follow-up Information    Follow up with Conni Slipper, MD On 07/09/2014.   Specialty:  Family Medicine   Why:  Appointment made for hospital follow-up on 1/18 at 8:45 am   Contact information:   [place] [address] [phone]       Lavon Paganini, MD 06/30/2014, 3:13 AM PGY-1, [facility]

## 2014-06-28 NOTE — Discharge Instructions (Signed)
You were admitted for likely bleeding in your stomach.  Your blood counts were stable.   Gastric Cancer Gastric cancer is a tumor which starts as a growth in your stomach. Cancer is a group of many related diseases that begin in cells, the building blocks of the body. Normally, cells grow and divide to produce more cells only when the body needs them. Sometimes, cells keep dividing when new cells are not needed. These extra cells may form a mass of tissue called a growth or tumor. Tumors can be either benign (not cancerous) or malignant (cancerous). Cancer can begin in any organ or tissue of the body. The original tumor (where the tumor started out) is called the primary cancer and is usually named for where it begins.  Several types of cancer can occur in the stomach. Adenocarcinoma is the most common, accounting for about 95% of gastric tumors. Other cancer types include carcinoid tumors, lymphoma, or gastrointestinal stromal cell tumors (GISTs).  CAUSES  Though the exact cause of gastric cancer is not known, there are several known risk factors:  Age over 70.  Female sex.  Race: more common in Asian, Pacific Islander, Hispanic, and African American people.  Diet high in smoked, salted, or pickled foods.  Tobacco and alcohol use.  History of stomach surgery, chronic gastritis, gastric polyps, or pernicious anemia.  Stomach infection with H. pylori bacteria (which also increases risk for ulcers).  Genetic factors including family history and blood type A. Note that very few people with risk factors actually develop gastric cancer. SYMPTOMS   Pain.  Loss of appetite.  Problems swallowing.  Nausea and vomiting.  Vomiting blood.  Abdominal pain.  Excessive gas or belching.  Weight loss.  General health problems. DIAGNOSIS  Your caregiver may suspect gastric cancer based on your symptoms and your physical exam. Further testing can diagnose gastric cancer. This may include  looking for blood in your stool. Gastroscopy (looking at your stomach through an instrument like a thin flexible telescope; also called endoscopy) may also be done. Biopsies can be done if an abnormal growth is found. This is the removal of a small piece of tissue from your stomach if your caregiver notices abnormalities or growths there. The biopsy is looked at under a microscope by a specialist who can tell if cancer is present. If cancer is confirmed, other tests may be needed to see if the cancer has spread beyond the stomach. TREATMENT   Surgical removal of the stomach (gastrectomy) is the only curative treatment. Sometimes only part of the stomach needs to be removed, depending on location of the cancer. Gastrectomy can be done if the cancer is found before it has spread beyond the stomach.  Radiation therapy and chemotherapy may be helpful. Chemotherapy and radiation therapy given after surgery may improve cure rates or make you feel better.  Antibiotics are sometimes used for H. pylori infection.  Advanced techniques to remove or destroy cancer without surgery are being researched.  Feeding tubes or bypass surgery may help if food becomes blocked. The possibility of curing your gastric cancer depends on the type of tumor, the location of the tumor, and whether the tumor has spread beyond the stomach. If your cancer cannot be cured, treatment may slow the progression of the disease. Treatments are also available to address pain or other symptoms of cancer. HOME CARE INSTRUCTIONS   Your caregiver may prescribe a specific type of diet. If you have had surgery, then a dietitian may help  you with an eating plan. Avoid red meats, processed meats, and salty, smoked, or pickled foods.  Take any prescribed medications as directed. Do not use more pain medication than directed.  You do not need to limit your activity unless instructed by your caregiver.  Avoid alcohol and tobacco use.  Keep  appointments for tests with your caregiver or specialists. SEEK MEDICAL CARE IF:   You have problems eating.  You have problems tolerating your medications.  You continue to lose weight despite your treatments. SEEK IMMEDIATE MEDICAL CARE IF:   You have uncontrolled nausea, vomiting, or diarrhea.  You have vomiting with blood or coffee-grounds-type material.  You have had chemotherapy and you have a fever.  You have uncontrolled pain. Document Released: 03/12/2004 Document Revised: 10/23/2013 Document Reviewed: 06/19/2008 [facility] Patient Information 2015 [place], [place]. This information is not intended to replace advice given to you by your health care provider. Make sure you discuss any questions you have with your health care provider.

## 2014-06-28 NOTE — Consult Note (Signed)
Patient ZO:XWRUEAV H Christine Armstrong      DOB: May 09, 1924      WUJ:[id number]     Consult Note from the Palliative Medicine Team at [facility] Requested by: Family Medicine   PCP: Tawanna Sat, MD Reason for Consultation: [place]    Phone [phone]  Assessment of patients Current state: I met today with ms. Christine Armstrong and some of her family at bedside. She has 14 children and most do not live locally. She lives with her daughter and her grandchildren and has great support. She says she is never alone. She tells me that she doesn't want to spend her time pursuing aggressive treatment for her cancer. She wants to comfortably live out her time at home with her family. She is very spiritual and is ready when her time comes. We discussed hospice care and how they may help her at home. They are open to this but wish to discuss with the rest of their family. Unfortunately I will be unable to follow up as she is planned to d/c today. Educated family on hospice consult and that any of her providers I am sure would support her in this.    Goals of Care: 1.  Code Status: DNR   2. Disposition: Home when stable. Considering hospice.    3. Symptom Management:   1. Changes in appetite: She says she feels hungry all the time but sometimes is unable to eat. She continues to loose weight. Continue feeding supplements. Could benefit from nutritional consult.  2. Weakness: Continue supportive treatment. Considering hospice for support.   4. Psychosocial: Emotional support provided to patient and family at bedside.   5. Spiritual: She finds comfort in her beliefs.    Brief HPI: 81 yo female with 2 week history of worsening dark stools and nausea/vomiting episode today . PMH is significant for gastric cancer (decided against treatment), HTN, CKD, Anemia requiring transfusion. PMH reviewed.    ROS: + weakness, + dizziness upon standing, + changes in appetite    PMH:  Past Medical History   Diagnosis Date  . CKD (chronic kidney disease) stage 3, GFR 30-59 ml/min   . HTN (hypertension)   . Benign skin lesion   . Osteoporosis   . H. pylori infection   . Anemia   . History of blood transfusion   . Cancer     stomach     PSH: Past Surgical History  Procedure Laterality Date  . Neg hx     I have reviewed the FH and SH and  If appropriate update it with new information. Allergies  Allergen Reactions  . Ace Inhibitors     REACTION: angioedema   Scheduled Meds: . antiseptic oral rinse  7 mL Mouth Rinse q12n4p  . carvedilol  12.5 mg Oral BID WC  . chlorhexidine  15 mL Mouth Rinse BID  . pantoprazole (PROTONIX) IV  40 mg Intravenous Q12H  . [START ON 06/29/2014] pneumococcal 23 valent vaccine  0.5 mL Intramuscular Tomorrow-1000   Continuous Infusions:  PRN Meds:.acetaminophen **OR** acetaminophen, ondansetron **OR** ondansetron (ZOFRAN) IV, simethicone    BP 124/59 mmHg  Pulse 63  Temp(Src) 98.4 F (36.9 C) (Oral)  Resp 21  SpO2 100%   PPS: 30%   Intake/Output Summary (Last 24 hours) at 06/28/14 1315 Last data filed at 06/28/14 1300  Gross per 24 hour  Intake    997 ml  Output      0 ml  Net    997  ml    Physical Exam:  General: NAD, sitting up in chair, thin, frail, elderly HEENT: [place]/AT, no JVD, moist mucous membranes Chest: No labored breathing, symmetric CVS: RRR, S1 S2 Abdomen: Soft, NT, ND, +BS Ext: MAE, no edema, warm to touch Neuro: Awake, alert, oriented x 3   Labs: CBC    Component Value Date/Time   WBC 3.3* 06/28/2014 0644   WBC 6.0 06/04/2014 1510   RBC 2.67* 06/28/2014 0644   RBC 3.33* 06/04/2014 1510   HGB 7.8* 06/28/2014 0644   HGB 9.5* 06/04/2014 1510   HCT 23.6* 06/28/2014 0644   HCT 29.6* 06/04/2014 1510   PLT 243 06/28/2014 0644   PLT 285 06/04/2014 1510   MCV 88.4 06/28/2014 0644   MCV 88.8 06/04/2014 1510   MCH 29.2 06/28/2014 0644   MCH 28.5 06/04/2014 1510   MCHC 33.1 06/28/2014 0644   MCHC 32.1 06/04/2014  1510   RDW 15.5 06/28/2014 0644   RDW 20.6* 06/04/2014 1510   LYMPHSABS 0.9 06/04/2014 1510   LYMPHSABS 0.8 02/01/2014 1056   MONOABS 0.7 06/04/2014 1510   MONOABS 0.4 02/01/2014 1056   EOSABS 0.0 06/04/2014 1510   EOSABS 0.0 02/01/2014 1056   BASOSABS 0.0 06/04/2014 1510   BASOSABS 0.0 02/01/2014 1056    BMET    Component Value Date/Time   NA 139 06/28/2014 0644   NA 140 02/21/2014 1536   K 3.7 06/28/2014 0644   K 4.4 02/21/2014 1536   CL 109 06/28/2014 0644   CO2 24 06/28/2014 0644   CO2 22 02/21/2014 1536   GLUCOSE 72 06/28/2014 0644   GLUCOSE 93 02/21/2014 1536   BUN 19 06/28/2014 0644   BUN 27.8* 02/21/2014 1536   CREATININE 1.38* 06/28/2014 0644   CREATININE 1.7* 02/21/2014 1536   CREATININE 1.97* 12/07/2013 1655   CALCIUM 7.8* 06/28/2014 0644   CALCIUM 8.6 02/21/2014 1536   GFRNONAA 33* 06/28/2014 0644   GFRAA 38* 06/28/2014 0644    CMP     Component Value Date/Time   NA 139 06/28/2014 0644   NA 140 02/21/2014 1536   K 3.7 06/28/2014 0644   K 4.4 02/21/2014 1536   CL 109 06/28/2014 0644   CO2 24 06/28/2014 0644   CO2 22 02/21/2014 1536   GLUCOSE 72 06/28/2014 0644   GLUCOSE 93 02/21/2014 1536   BUN 19 06/28/2014 0644   BUN 27.8* 02/21/2014 1536   CREATININE 1.38* 06/28/2014 0644   CREATININE 1.7* 02/21/2014 1536   CREATININE 1.97* 12/07/2013 1655   CALCIUM 7.8* 06/28/2014 0644   CALCIUM 8.6 02/21/2014 1536   PROT 6.4 06/27/2014 1719   PROT 6.6 02/21/2014 1536   ALBUMIN 2.9* 06/27/2014 1719   ALBUMIN 2.6* 02/21/2014 1536   AST 63* 06/27/2014 1719   AST 17 02/21/2014 1536   ALT 12 06/27/2014 1719   ALT 10 02/21/2014 1536   ALKPHOS 72 06/27/2014 1719   ALKPHOS 72 02/21/2014 1536   BILITOT 0.9 06/27/2014 1719   BILITOT <0.20 02/21/2014 1536   GFRNONAA 33* 06/28/2014 0644   GFRAA 38* 06/28/2014 0644     Time In Time Out Total Time Spent with Patient Total Overall Time  1105 1155 12min 75min    Greater than 50%  of this time was spent  counseling and coordinating care related to the above assessment and plan.  Vinie Sill, NP Palliative Medicine Team Pager # [phone] (M-F 8a-5p) Team Phone # [phone] (Nights/Weekends)

## 2014-07-02 ENCOUNTER — Telehealth: Payer: Self-pay

## 2014-07-02 NOTE — Telephone Encounter (Signed)
Unsure why this was sent to me. Will fwd to PCP.  Hilton Sinclair, MD

## 2014-07-02 NOTE — Telephone Encounter (Signed)
Christine Armstrong with [employer] left v/m that pt was discharged from [facility] on 06/28/2014 with dx gastrointestional hemorrhage unspecified. Christine Armstrong does not require cb unless requested by physician.

## 2014-07-04 NOTE — Telephone Encounter (Signed)
Pt seen by myself in the hospital. No need to return call.

## 2014-07-09 ENCOUNTER — Encounter: Payer: Self-pay | Admitting: Family Medicine

## 2014-07-09 ENCOUNTER — Ambulatory Visit (INDEPENDENT_AMBULATORY_CARE_PROVIDER_SITE_OTHER): Payer: Medicare Other | Admitting: Family Medicine

## 2014-07-09 VITALS — BP 125/79 | HR 101 | Temp 97.8°F | Ht 65.0 in | Wt 90.0 lb

## 2014-07-09 DIAGNOSIS — K922 Gastrointestinal hemorrhage, unspecified: Secondary | ICD-10-CM | POA: Diagnosis not present

## 2014-07-09 DIAGNOSIS — Z7189 Other specified counseling: Secondary | ICD-10-CM

## 2014-07-09 DIAGNOSIS — K921 Melena: Secondary | ICD-10-CM | POA: Diagnosis not present

## 2014-07-09 LAB — CBC
HEMATOCRIT: 29.9 % — AB (ref 36.0–46.0)
HEMOGLOBIN: 9.9 g/dL — AB (ref 12.0–15.0)
MCH: 29.6 pg (ref 26.0–34.0)
MCHC: 33.1 g/dL (ref 30.0–36.0)
MCV: 89.5 fL (ref 78.0–100.0)
MPV: 9 fL (ref 8.6–12.4)
Platelets: 280 10*3/uL (ref 150–400)
RBC: 3.34 MIL/uL — AB (ref 3.87–5.11)
RDW: 15.9 % — AB (ref 11.5–15.5)
WBC: 4.8 10*3/uL (ref 4.0–10.5)

## 2014-07-09 NOTE — Progress Notes (Signed)
Patient ID: Christine Armstrong, female   DOB: 1923/10/03, 81 y.o.   MRN: [id number] Subjective:   CC: Hospital follow up from nausea and vomiting  HPI:   Admitted 1/6-1/7 at [facility] for dark stools and ?coffee ground emesis.  No further episodes since then. Has lost small amount of weight since last visit (94 lb to 90 lb today weighed with coat on).  Burning in abdomen no matter what she eats that is worsened with food. Has had this since October. Location is upper abdomen and moves into chest. Thinks it is lots of gas. Hemoglobin decreased from 10 to 7.8 during admission (baseline 9-9.5). Repeat improved to 8.4 without intervention. Gastric cancer followed by Dr Benay Spice with oncology and pt elected to not undergo tx. Palliative care wa sunable to meet with pt to clarify goals of care - will need this. Defer to PCP. Taking aspirin every few days and is taking omeprazole daily. Is having daily bowel movements that is still like diarrhea and still black. No more vomiting or nausea, except mild nausea when pain in stomach. Pain can get severe. Hard to tell what triggers pain. Energy level still weak. Feels a little less weak than in hospital. Normal PO and is drinking well, peeing clear. Has appt in 1 week with Dr Benay Spice (onc)    Review of Systems - Per HPI.   PMH - suspected upper GI bleed, gastric cancer, HTN, CKD, thyroid goiter, anemia requiring transfusion  Smoking status: Never smoke SH: Lives with dtr    Objective:  Physical Exam BP 125/79 mmHg  Pulse 101  Temp(Src) 97.8 F (36.6 C) (Oral)  Ht 5\' 5"  (1.651 m)  Wt 90 lb (40.824 kg)  BMI 14.98 kg/m2 GEN: NAD, thin CV: RRR, 1+ B radial pulse SKIN: No pallor under lower eyelids Abd: thin, mildly tender epigastrum NEURO: Awake, alert, no focal deficit, normal speech     Assessment:     Christine Armstrong is a 81 y.o. female here for hospital follow up.    Plan:     # See problem list and after visit summary for  problem-specific plans.   # Health Maintenance: GOC discussion started today.  Follow-up: Follow up in 1 week for f/u of pain and melena.   I have spent at least 25 minutes in room with patient, >50% of this time used in counseling.   Hilton Sinclair, MD [place]

## 2014-07-09 NOTE — Patient Instructions (Signed)
Good to see you.  Stop the aspirin completely. Take the omeprazole twice daily. Do not take BP medication for now. We are checking labs today and I will call if NOT normal. Follow up with Dr Benay Spice to discuss if GI referral is a good idea. Follow up with [place] in 1 week.  Seek immediate care with much more bleeding, pain, or other concerns.  Best,  Hilton Sinclair, MD

## 2014-07-10 NOTE — Assessment & Plan Note (Signed)
Discussed at length with pt and daughter today.. Pt is capable of decision making and opts not to go to hospital and understands this risk. She also requests to be DNR. Dtr is supportive of this decision. - DNR form signed and should be scanned in chart;  - Would still benefit from PCP discussing palliative care referral and facilitating if pt amenable at f/u.

## 2014-07-10 NOTE — Assessment & Plan Note (Addendum)
Abdominal pain and melena- likely ulcer vs bleeding from gastric cancer that she opted to not treat. Hemodynamically stable though still has daily dark stools. Worry is that this could become a brisk bleed that would be life-threatening. Discussed at length with pt and daughter. Pt is capable of decision making and opts not to go to hospital and understands this risk. She also requests to be DNR. Dtr is supportive of this decision. - Rpt CBC - Increase PPI to BID - Completely stop aspirin. - F/u 1 week with [place] and with oncology as scheduled. Discuss utility of GI referral with Dr Benay Spice. - [place] discussion today per above; DNR form signed and should be scanned in chart; Would still benefit from PCP discussing palliative care referral and facilitating if pt amenable at f/u. - Watch BPs carefully, keep amlo and coreg off as BP good today and currently bleeding. - Continue iron supplement. - Precepted with Dr Lindell Noe.

## 2014-07-12 ENCOUNTER — Telehealth: Payer: Self-pay | Admitting: *Deleted

## 2014-07-12 NOTE — Telephone Encounter (Signed)
Daughter Burney Gauze called stating that she returned phone call from this clinic.  Nurse did not see any phone notes from today.  Left message on Jerwanda's voice mail for a call back. [place]  Cell phone     [phone].

## 2014-07-16 ENCOUNTER — Ambulatory Visit (HOSPITAL_BASED_OUTPATIENT_CLINIC_OR_DEPARTMENT_OTHER): Payer: Medicare Other | Admitting: Oncology

## 2014-07-16 ENCOUNTER — Other Ambulatory Visit (HOSPITAL_BASED_OUTPATIENT_CLINIC_OR_DEPARTMENT_OTHER): Payer: Medicare Other

## 2014-07-16 VITALS — BP 157/83 | HR 93 | Temp 98.4°F | Resp 18 | Ht 65.0 in | Wt 88.9 lb

## 2014-07-16 DIAGNOSIS — D509 Iron deficiency anemia, unspecified: Secondary | ICD-10-CM | POA: Diagnosis not present

## 2014-07-16 DIAGNOSIS — R197 Diarrhea, unspecified: Secondary | ICD-10-CM

## 2014-07-16 DIAGNOSIS — C169 Malignant neoplasm of stomach, unspecified: Secondary | ICD-10-CM | POA: Diagnosis not present

## 2014-07-16 DIAGNOSIS — R221 Localized swelling, mass and lump, neck: Secondary | ICD-10-CM

## 2014-07-16 LAB — CBC WITH DIFFERENTIAL/PLATELET
BASO%: 0.4 % (ref 0.0–2.0)
BASOS ABS: 0 10*3/uL (ref 0.0–0.1)
EOS%: 0.4 % (ref 0.0–7.0)
Eosinophils Absolute: 0 10*3/uL (ref 0.0–0.5)
HCT: 26.9 % — ABNORMAL LOW (ref 34.8–46.6)
HEMOGLOBIN: 8.7 g/dL — AB (ref 11.6–15.9)
LYMPH%: 14.7 % (ref 14.0–49.7)
MCH: 30 pg (ref 25.1–34.0)
MCHC: 32.3 g/dL (ref 31.5–36.0)
MCV: 92.8 fL (ref 79.5–101.0)
MONO#: 0.6 10*3/uL (ref 0.1–0.9)
MONO%: 12.2 % (ref 0.0–14.0)
NEUT#: 3.4 10*3/uL (ref 1.5–6.5)
NEUT%: 72.3 % (ref 38.4–76.8)
PLATELETS: 206 10*3/uL (ref 145–400)
RBC: 2.9 10*6/uL — ABNORMAL LOW (ref 3.70–5.45)
RDW: 15.1 % — ABNORMAL HIGH (ref 11.2–14.5)
WBC: 4.8 10*3/uL (ref 3.9–10.3)
lymph#: 0.7 10*3/uL — ABNORMAL LOW (ref 0.9–3.3)

## 2014-07-16 NOTE — Progress Notes (Signed)
  [facility] OFFICE PROGRESS NOTE   Diagnosis: Gastric cancer  INTERVAL HISTORY:   Ms. Rachel returns as scheduled. She was admitted 06/27/2014 after an episode of dark emesis. She was seen by palliative care while in the hospital. She was discharged home 06/28/2014. Ms. Terhaar reports she decided to be placed on a no CODE BLUE status. She denies nausea. No emesis since discharge from the hospital. She has "burning" upper abdominal discomfort despite Prilosec.  Objective:  Vital signs in last 24 hours:  Blood pressure 157/83, pulse 93, temperature 98.4 F (36.9 C), temperature source Oral, resp. rate 18, height 5\' 5"  (1.651 m), weight 88 lb 14.4 oz (40.325 kg), SpO2 100 %.    HEENT: Left lower neck mass Resp: Lungs clear bilaterally Cardio: Regular rate and rhythm GI: No hepatomegaly, fullness in the mid upper abdomen, nontender Vascular: No leg edema   Lab Results:  Lab Results  Component Value Date   WBC 4.8 07/16/2014   HGB 8.7* 07/16/2014   HCT 26.9* 07/16/2014   MCV 92.8 07/16/2014   PLT 206 07/16/2014   NEUTROABS 3.4 07/16/2014     Medications: I have reviewed the patient's current medications.  Assessment/Plan: 1. Gastric cancer  PET scan 02/28/2014 with a hypermetabolic mass in the gastric cardia region, hypermetabolic nodal metastasis to 3 gastrohepatic ligament lymph nodes, suspicion for hypermetabolic metastasis to a right hilar lymph node, enlargement of the thyroid gland with low metabolic activity felt to represent a benign goiter.  Palliative chemotherapy/radiation declined.  Supportive care approach elected. 2. Iron deficiency anemia secondary to #1. 3. Multiple colon polyps (tubular adenomas) on a colonoscopy 02/14/2014 including a large sigmoid colon polyp that was not removed  4. Diarrhea-etiology unclear. Question if related to the large sigmoid colon polyp. Improved. 5. Neck masses-PET scan 02/28/2014 showed low metabolic activity  associated with a large neck mass. The large neck mass appeared to be centered in the thyroid gland and was felt to likely represent a thyroid goiter.   Disposition:  Ms. Jewell has a history of locally advanced gastric cancer. She has elected not to receive palliative chemotherapy or radiation. She had an episode of dark emesis 2 weeks ago that may have been related to gastric bleeding. The hemoglobin remains low, but has not changed significantly over the past month.  I discussed treatment options with Ms. Spratlin and her daughter. She confirmed her desire for comfort care. She will be placed on a no CODE BLUE status. Ms. Behl agrees to a [facility] referral. She will try Maalox for the burning discomfort. She will return for an office visit in 2 months. We will see her sooner as needed.  Betsy Coder, MD  07/16/2014  4:38 PM

## 2014-07-17 ENCOUNTER — Telehealth: Payer: Self-pay | Admitting: *Deleted

## 2014-07-17 MED ORDER — LOPERAMIDE HCL 2 MG PO TABS: 2.0000 mg | ORAL_TABLET | Freq: Four times a day (QID) | ORAL | Status: AC | PRN

## 2014-07-17 MED ORDER — ALUMINUM-MAGNESIUM-SIMETHICONE 200-200-20 MG/5ML PO SUSP: 15.0000 mL | Freq: Three times a day (TID) | ORAL | Status: AC

## 2014-07-17 NOTE — Telephone Encounter (Signed)
Call from Hospice RN requesting OTC meds Imodium and Maalox be called in to pt's pharmacy so she may use her hospice benefit. Same done- per Dr. Benay Spice.

## 2014-07-31 ENCOUNTER — Encounter: Payer: Self-pay | Admitting: Family Medicine

## 2014-07-31 ENCOUNTER — Ambulatory Visit (INDEPENDENT_AMBULATORY_CARE_PROVIDER_SITE_OTHER): Admitting: Family Medicine

## 2014-07-31 VITALS — Temp 97.7°F | Ht 65.0 in | Wt 89.9 lb

## 2014-07-31 DIAGNOSIS — D500 Iron deficiency anemia secondary to blood loss (chronic): Secondary | ICD-10-CM | POA: Diagnosis not present

## 2014-07-31 DIAGNOSIS — C169 Malignant neoplasm of stomach, unspecified: Secondary | ICD-10-CM | POA: Diagnosis not present

## 2014-07-31 DIAGNOSIS — I10 Essential (primary) hypertension: Secondary | ICD-10-CM | POA: Diagnosis not present

## 2014-07-31 NOTE — Patient Instructions (Signed)
Pick up the [place] (don't take 3 times a day like the bottle will say), stop taking the [place] plus  If your blood pressures are consistently above 170 over 110, please call the clinic and we will discuss restarting one of the blood pressure medicines (coreg)

## 2014-07-31 NOTE — Progress Notes (Signed)
   Subjective:    Patient ID: Christine Armstrong, female    DOB: 01-Aug-1923, 81 y.o.   MRN: [id number]  HPI  CC: follow up  # Blood pressure:  Told to stop amlodipine, continue coreg for low blood pressures after discharge 1 month ago  Reports not taking coreg at all in past 3-4 weeks ROS: +dizziness (blames folivane), no CP, no SOB, no changes in vision  # Gastric cancer  Doing well, does not have any pain  Had hospice referral from oncologist, she is follow by hospice now and enjoys the care they are providing  States she would go to hospital if needed for other issues (like heart attack, stroke), and would continue to get blood if she requires transfusion.  # Anemia due to GI bleed secondary to above:  Taking folivane plus, but says it causes her to feel dizzy and does not like taking it at all  No melena or BRBPR  Review of Systems   See HPI for ROS. All other systems reviewed and are negative.  Past medical history, surgical, family, and social history reviewed and updated in the EMR as appropriate. Objective:  Temp(Src) 97.7 F (36.5 C) (Oral)  Ht 5\' 5"  (1.651 m)  Wt 89 lb 14.4 oz (40.778 kg)  BMI 14.96 kg/m2 Vitals and nursing note reviewed  General: NAD, pleasant, thin/cachectic appearing Neuro: alert and oriented, no focal deficits Psych: mood is good, affect is congruent.  Assessment & Plan:  See Problem List Documentation

## 2014-08-01 NOTE — Assessment & Plan Note (Signed)
BP slightly above goal for age at 165/95. She was recently hospitalized for GI bleed (from likely gastric cancer) requiring transfusion. Given the bleed discussed with patient favoring holding anti-htn meds due to risk of becoming hypotensive if bleed worsens; counseled regarding risk/benefit of falls vs stroke. Hospice does measure her BP, and discussed if consistently above systolic 340 and diastolic 370 or symptomatic will reinitiate coreg (previously on coreg and amlodipine).

## 2014-08-01 NOTE — Assessment & Plan Note (Signed)
No symptoms currently. On an iron containing pill from GI but pt believes this causes her to feel dizzy. She was prescribed integra on hospital discharge, asked her to pick this up and try (once daily). She likely needs iron supplementation with the GI bleed, but if they continue to cause her issues I would favor comfort/quality of life than continuing them.

## 2014-09-04 ENCOUNTER — Encounter: Payer: Self-pay | Admitting: Oncology

## 2014-09-04 NOTE — Progress Notes (Signed)
The daugther of the patient Jacqulin called and left message about FMLA. I called her back and left a message the forms come from her employer. I also left my fax# to get to me

## 2014-09-11 ENCOUNTER — Encounter: Payer: Self-pay | Admitting: Oncology

## 2014-09-11 NOTE — Progress Notes (Signed)
I placed fmla forms for Gean Quint the patient's daughter on desk of nurse for Dr. Benay Spice.

## 2014-09-12 ENCOUNTER — Encounter: Payer: Self-pay | Admitting: Oncology

## 2014-09-12 NOTE — Progress Notes (Signed)
I faxed fmla forms to [employer] [phone]

## 2014-09-13 ENCOUNTER — Telehealth: Payer: Self-pay | Admitting: Oncology

## 2014-09-13 ENCOUNTER — Telehealth: Payer: Self-pay | Admitting: *Deleted

## 2014-09-13 NOTE — Telephone Encounter (Signed)
Pt's daughter left a message on scheduling voicemail to cancel office visit for 3/29. Requested a return call to discuss Christine Armstrong's status. Same done, no answer. Left message informing her I'll try to call her tomorrow afternoon and to call us back as needed.

## 2014-09-13 NOTE — Telephone Encounter (Signed)
Pt's daughter called lft msg, I called back lft msg and then she called again I forward call to MD desk nurse [facility] and she would like for someone to know what is going on with pt..... KJ

## 2014-09-14 NOTE — Telephone Encounter (Signed)
Attempted to call pt's daughter re: her call on 3/24, no answer.

## 2014-09-18 ENCOUNTER — Other Ambulatory Visit: Payer: Medicare Other

## 2014-09-18 ENCOUNTER — Ambulatory Visit: Payer: Medicare Other | Admitting: Oncology

## 2014-09-19 ENCOUNTER — Telehealth: Payer: Self-pay | Admitting: Oncology

## 2014-09-19 NOTE — Telephone Encounter (Signed)
Received death certificate 6/95/07

## 2014-09-19 NOTE — Telephone Encounter (Signed)
Received message from [facility] - patient died September 19, 2014. Chart marked accordingly.

## 2014-09-21 DEATH — deceased

## 2016-07-12 IMAGING — PT NM PET TUM IMG INITIAL (PI) SKULL BASE T - THIGH
1 of 8 series · 1 of 25 positions shown · non-contrast
Comparison: CT 12/13/2013

CLINICAL DATA: Initial treatment strategy for gastric carcinoma.

EXAM:
NUCLEAR MEDICINE PET SKULL BASE TO THIGH
TECHNIQUE: 5.0 mCi F-18 FDG was injected intravenously. Full-ring PET imaging
was performed from the skull base to thigh after the radiotracer. CT
data was obtained and used for attenuation correction and anatomic
localization.
FASTING BLOOD GLUCOSE:  Value: 76 mg/dl

[Series 4: ct sk_thigh 5.0 b31f · axial · 5.0mm · 0.69mm/px · 1 of 216 slices shown]
[im 216/216  brain]
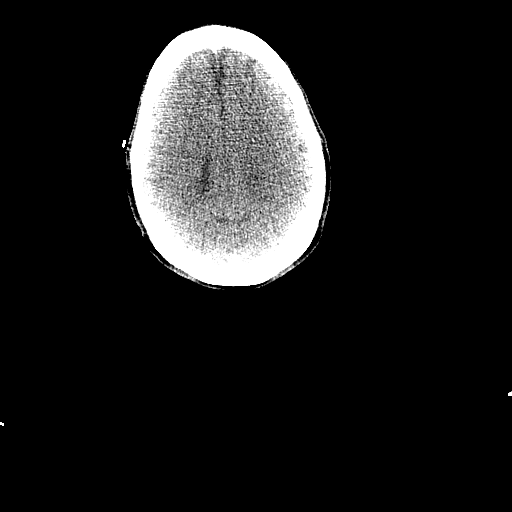

[1 of 25 positions shown; findings below may reference images not displayed]

FINDINGS: NECK

There is low metabolic activity associated with the large neck mass.
This large neck mass appears to be centered in the thyroid gland and
likely represents thyroid goiter. The mass measure approximately 5
cm on the left and right.

CHEST

There is a hypermetabolic right hilar lymph node with SUV max 5.4.
This node is difficult to define on the non contrast CT. No
suspicious pulmonary nodules.

ABDOMEN/PELVIS

There is circumferential hypermetabolic activity associated with the
proximal stomach (cardiac and fundal region). The metabolic activity
is intense with SUV max equal 17. There are 3 hypermetabolic
gastrohepatic ligament lymph nodes which are difficult to measure on
the non contrast exam. One node is present on image 110 series 4
measuring 8 mm short axis. These nodes are moderately hypermetabolic
with SUV max cecal 3.7.

There is no abnormal hypermetabolic activity within the liver. No
retroperitoneal hypermetabolic nodes. No hypermetabolic pelvic
nodes.

Focus of uptake within distal rectum is felt to represent activity
associated with stool.

SKELETON

No focal hypermetabolic activity to suggest skeletal metastasis.
IMPRESSION: 1. Hypermetabolic mass in the gastric cardiac region consistent with
primary gastric carcinoma.
2. Hypermetabolic nodal metastasis to 3 gastrohepatic ligament lymph
nodes.
3. Suspicion for hypermetabolic metastasis to aright hilar lymph
node.
4. Enlargement of the thyroid gland with low metabolic activity is
felt to represent a benign goiter.
